# Patient Record
Sex: Female | Born: 1985 | Race: White | Hispanic: No | Marital: Single | Smoking: Never smoker
Health system: Southern US, Community
[De-identification: ages and names within clinical notes are randomized; demographics above are authoritative.]

## PROBLEM LIST (undated history)

## (undated) DIAGNOSIS — Z8742 Personal history of other diseases of the female genital tract: Secondary | ICD-10-CM

## (undated) DIAGNOSIS — E669 Obesity, unspecified: Secondary | ICD-10-CM

## (undated) HISTORY — PX: WISDOM TOOTH EXTRACTION: SHX21

## (undated) HISTORY — DX: Personal history of other diseases of the female genital tract: Z87.42

## (undated) HISTORY — DX: Obesity, unspecified: E66.9

---

## 2014-12-11 DIAGNOSIS — Z8742 Personal history of other diseases of the female genital tract: Secondary | ICD-10-CM

## 2014-12-11 HISTORY — DX: Personal history of other diseases of the female genital tract: Z87.42

## 2014-12-11 HISTORY — PX: LEEP: SHX91

## 2018-08-19 ENCOUNTER — Emergency Department
Admission: EM | Admit: 2018-08-19 | Discharge: 2018-08-19 | Disposition: A | Discharge status: Home / Self Care | Payer: Medicaid Other | Attending: Student in an Organized Health Care Education/Training Program | Admitting: Student in an Organized Health Care Education/Training Program

## 2018-08-19 ENCOUNTER — Other Ambulatory Visit: Payer: Self-pay

## 2018-08-19 ENCOUNTER — Encounter: Payer: Self-pay | Admitting: Emergency Medicine

## 2018-08-19 DIAGNOSIS — N939 Abnormal uterine and vaginal bleeding, unspecified: Secondary | ICD-10-CM

## 2018-08-19 DIAGNOSIS — N938 Other specified abnormal uterine and vaginal bleeding: Secondary | ICD-10-CM | POA: Insufficient documentation

## 2018-08-19 LAB — WET PREP, GENITAL
SPERM: NONE SEEN
TRICH WET PREP: NONE SEEN
Yeast Wet Prep HPF POC: NONE SEEN

## 2018-08-19 LAB — POCT PREGNANCY, URINE: Preg Test, Ur: NEGATIVE

## 2018-08-19 LAB — HCG, QUANTITATIVE, PREGNANCY

## 2018-08-19 NOTE — ED Triage Notes (Signed)
Patient coming into night for vaginal bleeding. Patient's last period was 7/21 and did not have in the month of August and now has been having bleeding occasionally for the last 6 days. Unknown if she is pregnant.

## 2018-08-19 NOTE — ED Provider Notes (Addendum)
[facility] Emergency Department Provider Note    First MD Initiated Contact with Patient 08/19/18 2131     (approximate)  I have reviewed the triage vital signs and the nursing notes.   HISTORY  Chief Complaint Vaginal Bleeding    HPI Sharon Salinas is a 32 y.o. female presents the ER for vaginal bleeding.  States her last mental cycle was on the 21st of last month.  States she was late for her.  And had heavy but vaginal bleeding initially 6 days ago with some lighter spotting.  Was concerned she may be pregnant.  She said G5, P3.  Denies any pelvic pain at this time.  States that she is still spotting.  Does not have established care here.  Denies any vaginal discharge.  No dysuria.  History reviewed. No pertinent past medical history. No family history on file. History reviewed. No pertinent surgical history. There are no active problems to display for this patient.     Prior to Admission medications   Not on File    Allergies Patient has no known allergies.    Social History Social History   Tobacco Use  . Smoking status: Never Smoker  Substance Use Topics  . Alcohol use: Not Currently    Frequency: Never  . Drug use: Not on file    Review of Systems Patient denies headaches, rhinorrhea, blurry vision, numbness, shortness of breath, chest pain, edema, cough, abdominal pain, nausea, vomiting, diarrhea, dysuria, fevers, rashes or hallucinations unless otherwise stated above in HPI. ____________________________________________   PHYSICAL EXAM:  VITAL SIGNS: Vitals:   08/19/18 2034  BP: 117/77  Pulse: 98  Resp: 16  Temp: 98.7 F (37.1 C)  SpO2: 97%    Constitutional: Alert and oriented. Well appearing and in no acute distress. Eyes: Conjunctivae are normal.  Head: Atraumatic. Nose: No congestion/rhinnorhea. Mouth/Throat: Mucous membranes are moist.   Neck: Painless ROM.  Cardiovascular:   Good peripheral  circulation. Respiratory: Normal respiratory effort.  No retractions.  Gastrointestinal: Soft and nontender.  GU: Normal external genitalia.  Cervical os is closed.  No evidence of discharge.  Scant  blood in the vaginal vault. Musculoskeletal: No lower extremity tenderness .  No joint effusions. Neurologic:  Normal speech and language. No gross focal neurologic deficits are appreciated.  Skin:  Skin is warm, dry and intact. No rash noted. Psychiatric: Mood and affect are normal. Speech and behavior are normal.  ____________________________________________   LABS (all labs ordered are listed, but only abnormal results are displayed)  Results for orders placed or performed during the hospital encounter of 08/19/18 (from the past 24 hour(s))  hCG, quantitative, pregnancy     Status: None   Collection Time: 08/19/18  8:40 PM  Result Value Ref Range   hCG, Beta Chain, Quant, S <1 <5 mIU/mL  Pregnancy, urine POC     Status: None   Collection Time: 08/19/18  8:45 PM  Result Value Ref Range   Preg Test, Ur NEGATIVE NEGATIVE   ____________________________________________ ____________________________________________  RADIOLOGY   ____________________________________________   PROCEDURES  Procedure(s) performed:  Procedures    Critical Care performed: no ____________________________________________   INITIAL IMPRESSION / ASSESSMENT AND PLAN / ED COURSE  Pertinent labs & imaging results that were available during my care of the patient were reviewed by me and considered in my medical decision making (see chart for details).  DDX: DOB, AB, ectopic, miscarriage  Sharon Salinas is a 32 y.o. who presents to  the ED with symptoms as described above.  Patient is not pregnant.  Not consistent with ectopic.  Exam as above.  Presentation is reassuring.  Swab sent to evaluate for infectious process.  Not clinically consistent with PID. Will be given referral to OB/GYN.       ____________________________________________   FINAL CLINICAL IMPRESSION(S) / ED DIAGNOSES  Final diagnoses:  Vaginal bleeding      NEW MEDICATIONS STARTED DURING THIS VISIT:  New Prescriptions   No medications on file     Note:  This document was prepared using Dragon voice recognition software and may include unintentional dictation errors.     Willy Eddy, MD 08/19/18 2213    Willy Eddy, MD 08/19/18 2242

## 2018-08-20 LAB — CHLAMYDIA/NGC RT PCR (ARMC ONLY)
Chlamydia Tr: NOT DETECTED
N GONORRHOEAE: NOT DETECTED

## 2018-08-20 NOTE — ED Provider Notes (Signed)
Call attempted to discuss results of wet prep.  VM left with return number to discuss   Willy Eddy, MD 08/20/18 1456

## 2018-09-16 ENCOUNTER — Ambulatory Visit: Payer: Self-pay | Admitting: Family Medicine

## 2018-10-13 ENCOUNTER — Emergency Department
Admission: EM | Admit: 2018-10-13 | Discharge: 2018-10-13 | Disposition: A | Discharge status: Home / Self Care | Payer: Medicaid Other | Attending: Emergency Medicine | Admitting: Emergency Medicine

## 2018-10-13 ENCOUNTER — Encounter: Payer: Self-pay | Admitting: Emergency Medicine

## 2018-10-13 DIAGNOSIS — B3731 Acute candidiasis of vulva and vagina: Secondary | ICD-10-CM

## 2018-10-13 DIAGNOSIS — Z3A00 Weeks of gestation of pregnancy not specified: Secondary | ICD-10-CM | POA: Insufficient documentation

## 2018-10-13 DIAGNOSIS — N898 Other specified noninflammatory disorders of vagina: Secondary | ICD-10-CM | POA: Insufficient documentation

## 2018-10-13 DIAGNOSIS — O23591 Infection of other part of genital tract in pregnancy, first trimester: Secondary | ICD-10-CM | POA: Diagnosis not present

## 2018-10-13 DIAGNOSIS — O9989 Other specified diseases and conditions complicating pregnancy, childbirth and the puerperium: Secondary | ICD-10-CM | POA: Insufficient documentation

## 2018-10-13 DIAGNOSIS — B373 Candidiasis of vulva and vagina: Secondary | ICD-10-CM

## 2018-10-13 DIAGNOSIS — Z3490 Encounter for supervision of normal pregnancy, unspecified, unspecified trimester: Secondary | ICD-10-CM

## 2018-10-13 LAB — URINALYSIS, COMPLETE (UACMP) WITH MICROSCOPIC
BACTERIA UA: NONE SEEN
BILIRUBIN URINE: NEGATIVE
Glucose, UA: NEGATIVE mg/dL
Hgb urine dipstick: NEGATIVE
Ketones, ur: NEGATIVE mg/dL
Nitrite: NEGATIVE
PROTEIN: NEGATIVE mg/dL
SPECIFIC GRAVITY, URINE: 1.02 (ref 1.005–1.030)
pH: 6 (ref 5.0–8.0)

## 2018-10-13 LAB — CHLAMYDIA/NGC RT PCR (ARMC ONLY)
Chlamydia Tr: NOT DETECTED
N GONORRHOEAE: NOT DETECTED

## 2018-10-13 LAB — WET PREP, GENITAL
Clue Cells Wet Prep HPF POC: NONE SEEN
Sperm: NONE SEEN
Trich, Wet Prep: NONE SEEN

## 2018-10-13 LAB — POCT PREGNANCY, URINE: Preg Test, Ur: POSITIVE — AB

## 2018-10-13 MED ORDER — CLOTRIMAZOLE 1 % VA CREA: 1.0000 | TOPICAL_CREAM | Freq: Every day | VAGINAL | 0 refills | Status: AC

## 2018-10-13 NOTE — ED Notes (Signed)
First Nurse Note: Pt to ED for vaginal irritation. Pt is in NAD.

## 2018-10-13 NOTE — ED Provider Notes (Signed)
[facility] Emergency Department Provider Note  Time seen: 11:59 AM  I have reviewed the triage vital signs and the nursing notes.   HISTORY  Chief Complaint Vaginal Pain    HPI Sharon Salinas [place] is a 32 y.o. female with no significant past medical history G6 P3 A2 who presents to the emergency department for vaginal discomfort.  According to the patient over the past several days she has had vaginal swelling and itching.  Denies any abdominal pain.  Took a pregnancy test on Friday which was positive.  Denies any vaginal bleeding.  Minimal discharge, no significant increase in discharge.  Denies any fever.   History reviewed. No pertinent past medical history.  There are no active problems to display for this patient.   History reviewed. No pertinent surgical history.  Prior to Admission medications   Not on File    No Known Allergies  No family history on file.  Social History Social History   Tobacco Use  . Smoking status: Never Smoker  . Smokeless tobacco: Never Used  Substance Use Topics  . Alcohol use: Not Currently    Frequency: Never  . Drug use: Not on file    Review of Systems Constitutional: Negative for fever. Cardiovascular: Negative for chest pain. Respiratory: Negative for shortness of breath. Gastrointestinal: Negative for abdominal pain, vomiting and diarrhea. Genitourinary: Negative for urinary compaints Musculoskeletal: Negative for musculoskeletal complaints Neurological: Negative for headache All other ROS negative  ____________________________________________   PHYSICAL EXAM:  VITAL SIGNS: ED Triage Vitals  Enc Vitals Group     BP 10/13/18 1027 127/62     Pulse Rate 10/13/18 1027 96     Resp 10/13/18 1027 18     Temp 10/13/18 1027 98.5 F (36.9 C)     Temp Source 10/13/18 1027 Oral     SpO2 10/13/18 1027 99 %     Weight 10/13/18 1027 225 lb (102.1 kg)     Height 10/13/18 1027 5\' 9"  (1.753 m)   Head Circumference --      Peak Flow --      Pain Score 10/13/18 1040 3     Pain Loc --      Pain Edu? --      Excl. in GC? --    Constitutional: Alert and oriented. Well appearing and in no distress. Eyes: Normal exam ENT   Head: Normocephalic and atraumatic.   Mouth/Throat: Mucous membranes are moist. Cardiovascular: Normal rate, regular rhythm. No murmur Respiratory: Normal respiratory effort without tachypnea nor retractions. Breath sounds are clear  Gastrointestinal: Soft and nontender. No distention. Musculoskeletal: Nontender with normal range of motion in all extremities. No lower extremity tenderness or edema. Neurologic:  Normal speech and language. No gross focal neurologic deficits are appreciated. Skin:  Skin is warm, dry and intact.  Psychiatric: Mood and affect are normal.   ____________________________________________   INITIAL IMPRESSION / ASSESSMENT AND PLAN / ED COURSE  Pertinent labs & imaging results that were available during my care of the patient were reviewed by me and considered in my medical decision making (see chart for details).  Patient presents to the emergency department for vaginal swelling and itching over the past several days.  Found out she was pregnant 2 days ago.  Last menstrual period was 08/31/2018.  Denies any abdominal pain or vaginal bleeding or significant increase in vaginal discharge.  Patient's urinalysis is largely negative, urine pregnancy test is positive.  We will perform a pelvic examination in  the emergency department.  Believes to be at low risk for STDs.  Patient's wet prep is positive for yeast.  We will discharge on clotrimazole for 7 days.  ____________________________________________   FINAL CLINICAL IMPRESSION(S) / ED DIAGNOSES  Vaginal itching Vaginal swelling Yeast vaginitis   Minna Antis, MD 10/13/18 1253

## 2018-10-13 NOTE — ED Triage Notes (Signed)
Patient presents to the ED with vaginal discomfort.  Patient states she noticed, "swelling" to her vagina yesterday.  Patient denies any unusual discharge or redness.  Patient states she took a home pregnancy test Friday that was positive.  Patient denies urinary frequency or dysuria.

## 2018-10-13 NOTE — ED Notes (Signed)
Pelvic cart placed at bedside at this time. Awaiting MD to perform pelvic exam.

## 2018-10-31 ENCOUNTER — Encounter: Payer: Self-pay | Admitting: Family Medicine

## 2018-10-31 ENCOUNTER — Ambulatory Visit: Payer: Medicaid Other | Admitting: Family Medicine

## 2018-10-31 VITALS — BP 112/64 | HR 78 | Temp 97.9°F | Resp 16 | Ht 69.0 in | Wt 231.1 lb

## 2018-10-31 DIAGNOSIS — Z3200 Encounter for pregnancy test, result unknown: Secondary | ICD-10-CM | POA: Diagnosis not present

## 2018-10-31 DIAGNOSIS — Z3490 Encounter for supervision of normal pregnancy, unspecified, unspecified trimester: Secondary | ICD-10-CM | POA: Insufficient documentation

## 2018-10-31 LAB — HCG, QUANTITATIVE, PREGNANCY: HCG, TOTAL, QN: 57006 m[IU]/mL

## 2018-10-31 NOTE — Progress Notes (Addendum)
Name: Sharon Salinas [place]   MRN: [id number]    DOB: 1986-04-14   Date:10/31/2018       Progress Note  Subjective  Chief Complaint  Chief Complaint  Patient presents with  . Establish Care  . Routine Prenatal Visit    [redacted] weeks pregnant need referral confirmed at ER    HPI  Pt presents to establish care and for referral to GYN. She had positive pregnancy testing at the ER on 10/13/2018.  No imaging was performed at that visit, no blood testing was performed. Denies vaginal bleeding, some mild abdominal discomfort and cramping but nothing severe. Endorses fatigue and nausea, breast tenderness; no vomiting.   Kellie.Ishikawa - She has history Gestational DM, and 1 infant that was LGA. - She is not taking a prenatal vitamin - encouraged her to start ASAP. Denies drug use, smoking, or ETOH use.  - Lives with her 3 children and Female Partner Elmyra Ricks). No pets.   There are no active problems to display for this patient.   Past Surgical History:  Procedure Laterality Date  . LEEP    . WISDOM TOOTH EXTRACTION      Family History  Problem Relation Age of Onset  . Cancer Mother   . Seizures Mother   . Hyperlipidemia Father     Social History   Socioeconomic History  . Marital status: Single    Spouse name: Not on file  . Number of children: 3  . Years of education: Not on file  . Highest education level: Not on file  Occupational History  . Not on file  Social Needs  . Financial resource strain: Not hard at all  . Food insecurity:    Worry: Never true    Inability: Never true  . Transportation needs:    Medical: No    Non-medical: No  Tobacco Use  . Smoking status: Never Smoker  . Smokeless tobacco: Never Used  Substance and Sexual Activity  . Alcohol use: Not Currently    Frequency: Never  . Drug use: Never  . Sexual activity: Yes    Partners: Male  Lifestyle  . Physical activity:    Days per week: 0 days    Minutes per session: 0 min  . Stress: Not at all    Relationships  . Social connections:    Talks on phone: More than three times a week    Gets together: More than three times a week    Attends religious service: Never    Active member of club or organization: No    Attends meetings of clubs or organizations: Not on file    Relationship status: Living with partner  . Intimate partner violence:    Fear of current or ex partner: No    Emotionally abused: No    Physically abused: No    Forced sexual activity: No  Other Topics Concern  . Not on file  Social History Narrative  . Not on file    No current outpatient medications on file.  No Known Allergies  I personally reviewed active problem list, medication list, allergies, notes from last encounter, lab results with the patient/caregiver today.   ROS  Constitutional: Negative for fever or weight change.  Respiratory: Negative for cough and shortness of breath.   Cardiovascular: Negative for chest pain or palpitations.  Gastrointestinal: See HPI.  Musculoskeletal: Negative for gait problem or joint swelling.  Skin: Negative for rash.  Neurological: Negative for dizziness or headache.  No  other specific complaints in a complete review of systems (except as listed in HPI above).  Objective  Vitals:   10/31/18 1307  BP: 112/64  Pulse: 78  Resp: 16  Temp: 97.9 F (36.6 C)  TempSrc: Oral  SpO2: 99%  Weight: 231 lb 1.6 oz (104.8 kg)  Height: 5\' 9"  (1.753 m)   Body mass index is 34.13 kg/m.  Physical Exam  Constitutional: Patient appears well-developed and well-nourished. Obese. No distress.  HENT: Head: Normocephalic and atraumatic. Eyes: Conjunctivae and EOM are normal. No scleral icterus. Neck: Normal range of motion. Neck supple. No JVD present. No thyromegaly present.  Cardiovascular: Normal rate, regular rhythm and normal heart sounds.  No murmur heard. No BLE edema. Pulmonary/Chest: Effort normal and breath sounds normal. No respiratory  distress. Abdominal: Soft. Bowel sounds are normal, no distension. There is no tenderness. No masses. Fundus is not palpable. Musculoskeletal: Normal range of motion, no joint effusions. No gross deformities Neurological: Pt is alert and oriented to person, place, and time. No cranial nerve deficit. Coordination, balance, strength, speech and gait are normal.  Skin: Skin is warm and dry. No rash noted. No erythema.  Psychiatric: Patient has a normal mood and affect. behavior is normal. Judgment and thought content normal.  No results found for this or any previous visit (from the past 72 hour(s)).  PHQ2/9: Depression screen PHQ 2/9 10/31/2018  Decreased Interest 0  Down, Depressed, Hopeless 0  PHQ - 2 Score 0  Altered sleeping 0  Tired, decreased energy 0  Change in appetite 0  Feeling bad or failure about yourself  0  Trouble concentrating 0  Moving slowly or fidgety/restless 0  Suicidal thoughts 0  PHQ-9 Score 0  Difficult doing work/chores Not difficult at all   Fall Risk: Fall Risk  10/31/2018  Falls in the past year? 0  Injury with Fall? 0   Assessment & Plan  1. Possible pregnancy - B-HCG Quant - Ambulatory referral to Obstetrics / Gynecology - START Prenatal Vitamin ASAP. General pregnancy recommendations are provided including tylenol for pain PRN and checking with healthcare provider prior to taking any other medications.

## 2018-11-19 ENCOUNTER — Other Ambulatory Visit (HOSPITAL_COMMUNITY)
Admission: RE | Admit: 2018-11-19 | Discharge: 2018-11-19 | Disposition: A | Discharge status: Home / Self Care | Payer: Medicaid Other | Source: Ambulatory Visit | Attending: Obstetrics and Gynecology | Admitting: Obstetrics and Gynecology

## 2018-11-19 ENCOUNTER — Ambulatory Visit (INDEPENDENT_AMBULATORY_CARE_PROVIDER_SITE_OTHER): Payer: Medicaid Other

## 2018-11-19 ENCOUNTER — Other Ambulatory Visit: Payer: Self-pay | Admitting: Obstetrics and Gynecology

## 2018-11-19 ENCOUNTER — Encounter: Payer: Self-pay | Admitting: Obstetrics and Gynecology

## 2018-11-19 ENCOUNTER — Ambulatory Visit (INDEPENDENT_AMBULATORY_CARE_PROVIDER_SITE_OTHER): Payer: Medicaid Other | Admitting: Obstetrics and Gynecology

## 2018-11-19 VITALS — BP 108/67 | HR 69 | Ht 69.0 in | Wt 230.6 lb

## 2018-11-19 DIAGNOSIS — O3481 Maternal care for other abnormalities of pelvic organs, first trimester: Secondary | ICD-10-CM | POA: Diagnosis not present

## 2018-11-19 DIAGNOSIS — E668 Other obesity: Secondary | ICD-10-CM

## 2018-11-19 DIAGNOSIS — Z3A11 11 weeks gestation of pregnancy: Secondary | ICD-10-CM | POA: Diagnosis not present

## 2018-11-19 DIAGNOSIS — O09291 Supervision of pregnancy with other poor reproductive or obstetric history, first trimester: Secondary | ICD-10-CM | POA: Diagnosis not present

## 2018-11-19 DIAGNOSIS — Z9889 Other specified postprocedural states: Secondary | ICD-10-CM | POA: Insufficient documentation

## 2018-11-19 DIAGNOSIS — Z3491 Encounter for supervision of normal pregnancy, unspecified, first trimester: Secondary | ICD-10-CM | POA: Diagnosis not present

## 2018-11-19 DIAGNOSIS — Z0283 Encounter for blood-alcohol and blood-drug test: Secondary | ICD-10-CM

## 2018-11-19 DIAGNOSIS — O09299 Supervision of pregnancy with other poor reproductive or obstetric history, unspecified trimester: Secondary | ICD-10-CM

## 2018-11-19 DIAGNOSIS — Z124 Encounter for screening for malignant neoplasm of cervix: Secondary | ICD-10-CM

## 2018-11-19 DIAGNOSIS — Z113 Encounter for screening for infections with a predominantly sexual mode of transmission: Secondary | ICD-10-CM

## 2018-11-19 DIAGNOSIS — O99211 Obesity complicating pregnancy, first trimester: Secondary | ICD-10-CM

## 2018-11-19 DIAGNOSIS — Z3A10 10 weeks gestation of pregnancy: Secondary | ICD-10-CM | POA: Diagnosis not present

## 2018-11-19 DIAGNOSIS — N8311 Corpus luteum cyst of right ovary: Secondary | ICD-10-CM

## 2018-11-19 DIAGNOSIS — N83291 Other ovarian cyst, right side: Secondary | ICD-10-CM | POA: Diagnosis not present

## 2018-11-19 DIAGNOSIS — Z8632 Personal history of gestational diabetes: Secondary | ICD-10-CM

## 2018-11-19 DIAGNOSIS — E669 Obesity, unspecified: Secondary | ICD-10-CM | POA: Insufficient documentation

## 2018-11-19 LAB — POCT URINALYSIS DIPSTICK OB
Bilirubin, UA: NEGATIVE
Blood, UA: NEGATIVE
Glucose, UA: NEGATIVE
Ketones, UA: NEGATIVE
Leukocytes, UA: NEGATIVE
NITRITE UA: NEGATIVE
PROTEIN: NEGATIVE
SPEC GRAV UA: 1.02 (ref 1.010–1.025)
Urobilinogen, UA: 1 E.U./dL
pH, UA: 6 (ref 5.0–8.0)

## 2018-11-19 NOTE — Progress Notes (Signed)
Pt is present today for NOB PE. Pt stated that she is doing well no complaints.         Minna MerrittsCarolyn Nicole [place] presents for NOB nurse intake visit. [facility]18, with Maurice SmallEmily Boyce, FNP.  G6.  [id number].  LMP.08/31/18.  EDD 06/07/2019.  Ga. U/S 11/19/18 EDD 7/6/20Pregnancy education material explained and given.  0 cats in the home.  NOB labs ordered. BMI greater than 30. TSH/HbgA1c ordered.  HIV and drug screen explained and ordered. Genetic screening discussed. Genetic testing; Ordered. PNV encouraged. Pt is to follow up in 4 weeks for routine prenatal care with [facility]. [facility] and [facility] paper work was explained and signed. Pt stated that she understood.

## 2018-11-19 NOTE — Progress Notes (Signed)
OBSTETRIC INITIAL PRENATAL VISIT  Subjective:    Sharon Salinas [place] is being seen today for her first obstetrical visit.  This is not a planned pregnancy. Had confirmation at PCP office.  She is a [id number] female at [redacted]w[redacted]d gestation, Estimated Date of Delivery: 06/07/19 with Patient's last menstrual period was 08/31/2018 (exact dates).  Her obstetrical history is significant for history of gestational DM in last pregnancy (diet controlled). Relationship with FOB: spouse, living together. Patient does intend to breast feed. Pregnancy history fully reviewed.    OB History  Gravida Para Term Preterm AB Living  6 3 3  0 2 3  SAB TAB Ectopic Multiple Live Births  0 2 0 0 3    # Outcome Date GA Lbr Len/2nd Weight Sex Delivery Anes PTL Lv  6 Current           5 TAB 2017          4 Term 2014 [redacted]w[redacted]d  8 lb 13 oz (3.997 kg) F Vag-Spont   LIV  3 Term 2012 [redacted]w[redacted]d  9 lb 6 oz (4.252 kg) F Vag-Spont   LIV  2 TAB 2011          1 Term 2006 [redacted]w[redacted]d  7 lb 12 oz (3.515 kg) F Vag-Spont   LIV    Obstetric Comments  G2 and G3 were inductions of labor.     Gynecologic History:  Last pap smear was 201or 2018 (patient is unsure).  Results were normal.  Admits h/o abnormal pap smear in the past.  H/o LEEP procedure in 2016. Denies  history of STIs.    Past Medical History:  Diagnosis Date  . No pertinent past medical history      Family History  Problem Relation Age of Onset  . Cancer Mother   . Seizures Mother   . Hyperlipidemia Father      Past Surgical History:  Procedure Laterality Date  . LEEP  2016  . WISDOM TOOTH EXTRACTION       Social History   Socioeconomic History  . Marital status: Single    Spouse name: Not on file  . Number of children: 3  . Years of education: Not on file  . Highest education level: Not on file  Occupational History  . Not on file  Social Needs  . Financial resource strain: Not hard at all  . Food insecurity:    Worry: Never true    Inability:  Never true  . Transportation needs:    Medical: No    Non-medical: No  Tobacco Use  . Smoking status: Never Smoker  . Smokeless tobacco: Never Used  Substance and Sexual Activity  . Alcohol use: Not Currently    Frequency: Never  . Drug use: Never  . Sexual activity: Yes    Partners: Male  Lifestyle  . Physical activity:    Days per week: 0 days    Minutes per session: 0 min  . Stress: Not at all  Relationships  . Social connections:    Talks on phone: More than three times a week    Gets together: More than three times a week    Attends religious service: Never    Active member of club or organization: No    Attends meetings of clubs or organizations: Not on file    Relationship status: Living with partner  . Intimate partner violence:    Fear of current or ex partner: No    Emotionally abused: No  Physically abused: No    Forced sexual activity: No  Other Topics Concern  . Not on file  Social History Narrative  . Not on file     Current Outpatient Medications on File Prior to Visit  Medication Sig Dispense Refill  . acetaminophen (TYLENOL) 500 MG tablet Take 500 mg by mouth every 6 (six) hours as needed.    . Prenatal Vit-Fe Fumarate-FA (PRENATAL MULTIVITAMIN) TABS tablet Take 1 tablet by mouth daily at 12 noon.     No current facility-administered medications on file prior to visit.      No Known Allergies    Review of Systems General:Not Present- Fever, Weight Loss and Weight Gain. Skin:Not Present- Rash. HEENT:Not Present- Blurred Vision, Headache and Bleeding Gums. Respiratory:Not Present- Difficulty Breathing. Breast:Not Present- Breast Mass. Cardiovascular:Not Present- Chest Pain, Elevated Blood Pressure, Fainting / Blacking Out and Shortness of Breath. Gastrointestinal:Not Present- Abdominal Pain, Constipation, Vomiting. Positive for nausea (however is resolving). Female Genitourinary:Not Present- Frequency, Painful Urination, Pelvic Pain,  Vaginal Bleeding, Vaginal Discharge, Contractions, regular, Fetal Movements Decreased, Urinary Complaints and Vaginal Fluid. Musculoskeletal:Not Present- Back Pain and Leg Cramps. Neurological:Not Present- Dizziness. Psychiatric:Not Present- Depression.     Objective:   Blood pressure 108/67, pulse 69, height 5\' 9"  (1.753 m), weight 230 lb 9.6 oz (104.6 kg), last menstrual period 08/31/2018.  Body mass index is 34.05 kg/m.  General Appearance:    Alert, cooperative, no distress, appears stated age, moderate obesity  Head:    Normocephalic, without obvious abnormality, atraumatic  Eyes:    PERRL, conjunctiva/corneas clear, EOM's intact, both eyes  Ears:    Normal external ear canals, both ears  Nose:   Nares normal, septum midline, mucosa normal, no drainage or sinus tenderness  Throat:   Lips, mucosa, and tongue normal; teeth and gums normal  Neck:   Supple, symmetrical, trachea midline, no adenopathy; thyroid: no enlargement/tenderness/nodules; no carotid bruit or JVD  Back:     Symmetric, no curvature, ROM normal, no CVA tenderness  Lungs:     Clear to auscultation bilaterally, respirations unlabored  Chest Wall:    No tenderness or deformity   Heart:    Regular rate and rhythm, S1 and S2 normal, no murmur, rub or gallop  Breast Exam:    No tenderness, masses, or nipple abnormality  Abdomen:     Soft, non-tender, bowel sounds active all four quadrants, no masses, no organomegaly. FHT unable to obtain by Doppler.  Genitalia:    Pelvic:external genitalia normal, vagina without lesions, discharge, or tenderness, rectovaginal septum  normal. Cervix normal in appearance, no cervical motion tenderness, no adnexal masses or tenderness.  Pregnancy positive findings: uterine enlargement: 10-11 wk size, nontender.   Rectal:    Normal external sphincter.  No hemorrhoids appreciated. Internal exam not done.   Extremities:   Extremities normal, atraumatic, no cyanosis or edema  Pulses:   2+ and  symmetric all extremities  Skin:   Skin color, texture, turgor normal, no rashes or lesions  Lymph nodes:   Cervical, supraclavicular, and axillary nodes normal  Neurologic:   CNII-XII intact, normal strength, sensation and reflexes throughout       Assessment:    Pregnancy at 11 and 3/7 weeks by dates   History of abnormal pap smear with LEEP Moderate obesity History of gestational diabetes (diet controlled)  Plan:    Initial labs ordered.  Will get ultrasound today to assess for viability/dating as unable to detect FHT on exam.  Prenatal vitamins encouraged. Problem  list reviewed and updated. New OB counseling:  The patient has been given an overview regarding routine prenatal care.  Recommendations regarding diet, weight gain, and exercise in pregnancy were given. Prenatal testing, optional genetic testing, and ultrasound use in pregnancy were reviewed.  Patient desires cell-free DNA testing with [facility]. Will order.  Benefits of Breast Feeding were discussed. The patient is encouraged to consider nursing her baby post partum. History of GDM in last pregnancy. Will need early glucola next visit.  History of LEEP with no prior history of preterm birth or cervical incompetence. Follow up in 4 weeks.  50% of 30 minute visit spent on counseling and coordination of care.     The patient has Medicaid.  [facility] Medicaid Risk Screening Form completed today   Hildred Laserherry, Jamin Panther, MD [facility]

## 2018-11-19 NOTE — Patient Instructions (Signed)
First Trimester of Pregnancy The first trimester of pregnancy is from week 1 until the end of week 13 (months 1 through 3). During this time, your baby will begin to develop inside you. At 6-8 weeks, the eyes and face are formed, and the heartbeat can be seen on ultrasound. At the end of 12 weeks, all the baby's organs are formed. Prenatal care is all the medical care you receive before the birth of your baby. Make sure you get good prenatal care and follow all of your doctor's instructions. Follow these instructions at home: Medicines  Take over-the-counter and prescription medicines only as told by your doctor. Some medicines are safe and some medicines are not safe during pregnancy.  Take a prenatal vitamin that contains at least 600 micrograms (mcg) of folic acid.  If you have trouble pooping (constipation), take medicine that will make your stool soft (stool softener) if your doctor approves. Eating and drinking  Eat regular, healthy meals.  Your doctor will tell you the amount of weight gain that is right for you.  Avoid raw meat and uncooked cheese.  If you feel sick to your stomach (nauseous) or throw up (vomit): ? Eat 4 or 5 small meals a day instead of 3 large meals. ? Try eating a few soda crackers. ? Drink liquids between meals instead of during meals.  To prevent constipation: ? Eat foods that are high in fiber, like fresh fruits and vegetables, whole grains, and beans. ? Drink enough fluids to keep your pee (urine) clear or pale yellow. Activity  Exercise only as told by your doctor. Stop exercising if you have cramps or pain in your lower belly (abdomen) or low back.  Do not exercise if it is too hot, too humid, or if you are in a place of great height (high altitude).  Try to avoid standing for long periods of time. Move your legs often if you must stand in one place for a long time.  Avoid heavy lifting.  Wear low-heeled shoes. Sit and stand up straight.  You  can have sex unless your doctor tells you not to. Relieving pain and discomfort  Wear a good support bra if your breasts are sore.  Take warm water baths (sitz baths) to soothe pain or discomfort caused by hemorrhoids. Use hemorrhoid cream if your doctor says it is okay.  Rest with your legs raised if you have leg cramps or low back pain.  If you have puffy, bulging veins (varicose veins) in your legs: ? Wear support hose or compression stockings as told by your doctor. ? Raise (elevate) your feet for 15 minutes, 3-4 times a day. ? Limit salt in your food. Prenatal care  Schedule your prenatal visits by the twelfth week of pregnancy.  Write down your questions. Take them to your prenatal visits.  Keep all your prenatal visits as told by your doctor. This is important. Safety  Wear your seat belt at all times when driving.  Make a list of emergency phone numbers. The list should include numbers for family, friends, the hospital, and police and fire departments. General instructions  Ask your doctor for a referral to a local prenatal class. Begin classes no later than at the start of month 6 of your pregnancy.  Ask for help if you need counseling or if you need help with nutrition. Your doctor can give you advice or tell you where to go for help.  Do not use hot tubs, steam rooms, or  saunas.  Do not douche or use tampons or scented sanitary pads.  Do not cross your legs for long periods of time.  Avoid all herbs and alcohol. Avoid drugs that are not approved by your doctor.  Do not use any tobacco products, including cigarettes, chewing tobacco, and electronic cigarettes. If you need help quitting, ask your doctor. You may get counseling or other support to help you quit.  Avoid cat litter boxes and soil used by cats. These carry germs that can cause birth defects in the baby and can cause a loss of your baby (miscarriage) or stillbirth.  Visit your dentist. At home, brush  your teeth with a soft toothbrush. Be gentle when you floss. Contact a doctor if:  You are dizzy.  You have mild cramps or pressure in your lower belly.  You have a nagging pain in your belly area.  You continue to feel sick to your stomach, you throw up, or you have watery poop (diarrhea).  You have a bad smelling fluid coming from your vagina.  You have pain when you pee (urinate).  You have increased puffiness (swelling) in your face, hands, legs, or ankles. Get help right away if:  You have a fever.  You are leaking fluid from your vagina.  You have spotting or bleeding from your vagina.  You have very bad belly cramping or pain.  You gain or lose weight rapidly.  You throw up blood. It may look like coffee grounds.  You are around people who have [place] measles, fifth disease, or chickenpox.  You have a very bad headache.  You have shortness of breath.  You have any kind of trauma, such as from a fall or a car accident. Summary  The first trimester of pregnancy is from week 1 until the end of week 13 (months 1 through 3).  To take care of yourself and your unborn baby, you will need to eat healthy meals, take medicines only if your doctor tells you to do so, and do activities that are safe for you and your baby.  Keep all follow-up visits as told by your doctor. This is important as your doctor will have to ensure that your baby is healthy and growing well. This information is not intended to replace advice given to you by your health care provider. Make sure you discuss any questions you have with your health care provider. Document Released: 05/15/2008 Document Revised: 12/05/2016 Document Reviewed: 12/05/2016 Elsevier Interactive Patient Education  2017 [place]. Common Medications Safe in Pregnancy  Acne:      Constipation:  Benzoyl Peroxide     Colace  Clindamycin      Dulcolax Suppository  Topica Erythromycin     Fibercon  Salicylic  Acid      Metamucil         Miralax AVOID:        Senakot   Accutane    Cough:  Retin-A       Cough Drops  Tetracycline      Phenergan w/ Codeine if Rx  Minocycline      Robitussin (Plain & DM)  Antibiotics:     Crabs/Lice:  Ceclor       RID  Cephalosporins    AVOID:  E-Mycins      Kwell  Keflex  Macrobid/Macrodantin   Diarrhea:  Penicillin      Kao-Pectate  Zithromax      Imodium AD         PUSH  FLUIDS AVOID:       Cipro     Fever:  Tetracycline      Tylenol (Regular or Extra  Minocycline       Strength)  Levaquin      Extra Strength-Do not          Exceed 8 tabs/24 hrs Caffeine:        <200mg/day (equiv. To 1 cup of coffee or  approx. 3 12 oz sodas)         Gas: Cold/Hayfever:       Gas-X  Benadryl      Mylicon  Claritin       Phazyme  **Claritin-D        Chlor-Trimeton    Headaches:  Dimetapp      ASA-Free Excedrin  Drixoral-Non-Drowsy     Cold Compress  Mucinex (Guaifenasin)     Tylenol (Regular or Extra  Sudafed/Sudafed-12 Hour     Strength)  **Sudafed PE Pseudoephedrine   Tylenol Cold & Sinus     Vicks Vapor Rub  Zyrtec  **AVOID if Problems With Blood Pressure         Heartburn: Avoid lying down for at least 1 hour after meals  Aciphex      Maalox     Rash:  Milk of Magnesia     Benadryl    Mylanta       1% Hydrocortisone Cream  Pepcid  Pepcid Complete   Sleep Aids:  Prevacid      Ambien   Prilosec       Benadryl  Rolaids       Chamomile Tea  Tums (Limit 4/day)     Unisom  Zantac       Tylenol PM         Warm milk-add vanilla or  Hemorrhoids:       Sugar for taste  Anusol/Anusol H.C.  (RX: Analapram 2.5%)  Sugar Substitutes:  Hydrocortisone OTC     Ok in moderation  Preparation H      Tucks        Vaseline lotion applied to tissue with wiping    Herpes:     Throat:  Acyclovir      Oragel  Famvir  Valtrex     Vaccines:         Flu Shot Leg Cramps:       *Gardasil  Benadryl      Hepatitis A         Hepatitis B Nasal  Spray:       Pneumovax  Saline Nasal Spray     Polio Booster         Tetanus Nausea:       Tuberculosis test or PPD  Vitamin B6 25 mg TID   AVOID:    Dramamine      *Gardasil  Emetrol       Live Poliovirus  Ginger Root 250 mg QID    MMR (measles, mumps &  High Complex Carbs @ Bedtime    rebella)  Sea Bands-Accupressure    Varicella (Chickenpox)  Unisom 1/2 tab TID     *No known complications           If received before Pain:         Known pregnancy;   Darvocet       Resume series after  Lortab        Delivery  Percocet    Yeast:   Tramadol        Femstat  Tylenol 3      Gyne-lotrimin  Ultram       Monistat  Vicodin           MISC:         All Sunscreens           Hair Coloring/highlights          Insect Repellant's          (Including DEET)         Mystic Tans  

## 2018-11-20 ENCOUNTER — Telehealth: Payer: Self-pay | Admitting: Obstetrics and Gynecology

## 2018-11-20 LAB — URINALYSIS, ROUTINE W REFLEX MICROSCOPIC
Bilirubin, UA: NEGATIVE
Glucose, UA: NEGATIVE
LEUKOCYTES UA: NEGATIVE
Nitrite, UA: NEGATIVE
Protein, UA: NEGATIVE
RBC, UA: NEGATIVE
Specific Gravity, UA: 1.022 (ref 1.005–1.030)
Urobilinogen, Ur: 1 mg/dL (ref 0.2–1.0)
pH, UA: 6 (ref 5.0–7.5)

## 2018-11-20 LAB — DRUG PROFILE, UR, 9 DRUGS (LABCORP)
AMPHETAMINES, URINE: NEGATIVE ng/mL
Barbiturate Quant, Ur: NEGATIVE ng/mL
Benzodiazepine Quant, Ur: NEGATIVE ng/mL
COCAINE (METAB.): NEGATIVE ng/mL
Cannabinoid Quant, Ur: NEGATIVE ng/mL
METHADONE SCREEN, URINE: NEGATIVE ng/mL
OPIATE QUANT UR: NEGATIVE ng/mL
PCP Quant, Ur: NEGATIVE ng/mL
Propoxyphene: NEGATIVE ng/mL

## 2018-11-20 LAB — VARICELLA ZOSTER ANTIBODY, IGG: VARICELLA: 1166 {index} (ref >165)

## 2018-11-20 LAB — ABO AND RH: Rh Factor: POSITIVE

## 2018-11-20 LAB — HEMOGLOBIN A1C
Est. average glucose Bld gHb Est-mCnc: 105 mg/dL
HEMOGLOBIN A1C: 5.3 % (ref 4.8–5.6)

## 2018-11-20 LAB — RPR: RPR: NONREACTIVE

## 2018-11-20 LAB — NICOTINE SCREEN, URINE: Cotinine Ql Scrn, Ur: NEGATIVE ng/mL

## 2018-11-20 LAB — ANTIBODY SCREEN: Antibody Screen: NEGATIVE

## 2018-11-20 LAB — TSH: TSH: 1.3 u[IU]/mL (ref 0.450–4.500)

## 2018-11-20 LAB — HIV ANTIBODY (ROUTINE TESTING W REFLEX): HIV SCREEN 4TH GENERATION: NONREACTIVE

## 2018-11-20 LAB — RUBELLA SCREEN: Rubella Antibodies, IGG: 6.29 index (ref >0.99)

## 2018-11-20 LAB — HEPATITIS B SURFACE ANTIGEN: Hepatitis B Surface Ag: NEGATIVE

## 2018-11-20 NOTE — Telephone Encounter (Signed)
Please inform patient that she does have a cyst on her right ovary, is about the size of a plum.  This is common in pregnancy and usually will be gone by the time of her next ultrasound at 20 weeks.  If she begins to have significant pain on her right side, we would need to evaluate her sooner to make sure that it has not grown in size.    Dr. Valentino Saxonherry

## 2018-11-20 NOTE — Telephone Encounter (Signed)
The patient called and stated that she would like to speak with her nurse in regards to checking the status of her u/s being reviewed by Dr. Valentino Saxonherry. No other information was disclosed. Please advise.

## 2018-11-21 LAB — GC/CHLAMYDIA PROBE AMP
Chlamydia trachomatis, NAA: NEGATIVE
Neisseria gonorrhoeae by PCR: NEGATIVE

## 2018-11-21 LAB — URINE CULTURE, OB REFLEX: Organism ID, Bacteria: NO GROWTH

## 2018-11-21 LAB — CULTURE, OB URINE

## 2018-11-21 NOTE — Telephone Encounter (Signed)
[facility]

## 2018-11-22 LAB — CYTOLOGY - PAP
Diagnosis: NEGATIVE
HPV: NOT DETECTED

## 2018-12-02 ENCOUNTER — Emergency Department
Admission: EM | Admit: 2018-12-02 | Discharge: 2018-12-02 | Disposition: A | Discharge status: Home / Self Care | Payer: Medicaid Other | Attending: Student in an Organized Health Care Education/Training Program | Admitting: Student in an Organized Health Care Education/Training Program

## 2018-12-02 ENCOUNTER — Encounter: Payer: Self-pay | Admitting: Emergency Medicine

## 2018-12-02 ENCOUNTER — Emergency Department: Payer: Medicaid Other

## 2018-12-02 ENCOUNTER — Other Ambulatory Visit: Payer: Self-pay

## 2018-12-02 DIAGNOSIS — O9989 Other specified diseases and conditions complicating pregnancy, childbirth and the puerperium: Secondary | ICD-10-CM | POA: Insufficient documentation

## 2018-12-02 DIAGNOSIS — R0602 Shortness of breath: Secondary | ICD-10-CM | POA: Diagnosis not present

## 2018-12-02 DIAGNOSIS — Z3A12 12 weeks gestation of pregnancy: Secondary | ICD-10-CM | POA: Insufficient documentation

## 2018-12-02 DIAGNOSIS — R071 Chest pain on breathing: Secondary | ICD-10-CM | POA: Diagnosis not present

## 2018-12-02 DIAGNOSIS — R0789 Other chest pain: Secondary | ICD-10-CM | POA: Insufficient documentation

## 2018-12-02 LAB — COMPREHENSIVE METABOLIC PANEL
ALT: 12 U/L (ref 0–44)
AST: 15 U/L (ref 15–41)
Albumin: 3.7 g/dL (ref 3.5–5.0)
Alkaline Phosphatase: 44 U/L (ref 38–126)
Anion gap: 8 (ref 5–15)
BUN: 12 mg/dL (ref 6–20)
CO2: 25 mmol/L (ref 22–32)
Calcium: 9 mg/dL (ref 8.9–10.3)
Chloride: 105 mmol/L (ref 98–111)
Creatinine, Ser: 0.62 mg/dL (ref 0.44–1.00)
GFR calc Af Amer: >60 mL/min (ref >60)
GFR calc non Af Amer: >60 mL/min (ref >60)
Glucose, Bld: 133 mg/dL — ABNORMAL HIGH (ref 70–99)
POTASSIUM: 3.3 mmol/L — AB (ref 3.5–5.1)
Sodium: 138 mmol/L (ref 135–145)
Total Bilirubin: 0.3 mg/dL (ref 0.3–1.2)
Total Protein: 7.3 g/dL (ref 6.5–8.1)

## 2018-12-02 LAB — URINALYSIS, COMPLETE (UACMP) WITH MICROSCOPIC
Bilirubin Urine: NEGATIVE
Glucose, UA: NEGATIVE mg/dL
Hgb urine dipstick: NEGATIVE
Ketones, ur: NEGATIVE mg/dL
Leukocytes, UA: NEGATIVE
Nitrite: NEGATIVE
Protein, ur: NEGATIVE mg/dL
Specific Gravity, Urine: 1.015 (ref 1.005–1.030)
WBC, UA: NONE SEEN WBC/hpf (ref 0–5)
pH: 7 (ref 5.0–8.0)

## 2018-12-02 LAB — CBC
HCT: 38.6 % (ref 36.0–46.0)
Hemoglobin: 12.8 g/dL (ref 12.0–15.0)
MCH: 29.3 pg (ref 26.0–34.0)
MCHC: 33.2 g/dL (ref 30.0–36.0)
MCV: 88.3 fL (ref 80.0–100.0)
Platelets: 305 10*3/uL (ref 150–400)
RBC: 4.37 MIL/uL (ref 3.87–5.11)
RDW: 12.5 % (ref 11.5–15.5)
WBC: 17 10*3/uL — ABNORMAL HIGH (ref 4.0–10.5)
nRBC: 0 % (ref 0.0–0.2)

## 2018-12-02 LAB — LIPASE, BLOOD: Lipase: 27 U/L (ref 11–51)

## 2018-12-02 LAB — POCT PREGNANCY, URINE: Preg Test, Ur: POSITIVE — AB

## 2018-12-02 LAB — TROPONIN I: Troponin I: <0.03 ng/mL (ref <0.03)

## 2018-12-02 LAB — FIBRIN DERIVATIVES D-DIMER (ARMC ONLY): Fibrin derivatives D-dimer (ARMC): 282.02 ng/mL (FEU) (ref 0.00–499.00)

## 2018-12-02 MED ORDER — ACETAMINOPHEN 500 MG PO TABS
1000.0000 mg | ORAL_TABLET | Freq: Once | ORAL | Status: AC
  Administered 2018-12-02: 1000 mg via ORAL
  Filled 2018-12-02: qty 2

## 2018-12-02 NOTE — ED Notes (Signed)
Pt back from US att

## 2018-12-02 NOTE — ED Provider Notes (Signed)
[facility] Emergency Department Provider Note    First MD Initiated Contact with Patient 12/02/18 [phone]     (approximate)  I have reviewed the triage vital signs and the nursing notes.   HISTORY  Chief Complaint Chest Pain    HPI Sharon Salinas is a 31 y.o. female presents the ER for evaluation of right posterior chest pain.  Symptoms became more severe today.  She is had 4 episodes similar to this in the past.  Has had x-rays but no other additional evaluation.  States it does hurt to take a deep breath and she does have associated shortness of breath and fatigue with it.  States that the pain was so severe she was almost doubled over in pain.  States it wraps around to the right anterior aspect of her chest.  Denies any trauma or rashes.  Denies any fevers.  Does not smoke.  She is currently pregnant.    Past Medical History:  Diagnosis Date  . History of abnormal cervical Pap smear 2016   s/p LEEP  . Obesity (BMI 30-39.9)    Family History  Problem Relation Age of Onset  . Cancer Mother   . Seizures Mother   . Hyperlipidemia Father    Past Surgical History:  Procedure Laterality Date  . LEEP  2016  . WISDOM TOOTH EXTRACTION     Patient Active Problem List   Diagnosis Date Noted  . History of gestational diabetes in prior pregnancy, currently pregnant 11/19/2018  . Mild obesity 11/19/2018  . History of loop electrical excision procedure (LEEP) 11/19/2018  . Pregnancy 10/31/2018      Prior to Admission medications   Medication Sig Start Date End Date Taking? Authorizing Provider  acetaminophen (TYLENOL) 500 MG tablet Take 500 mg by mouth every 6 (six) hours as needed.    [provider]  Prenatal Vit-Fe Fumarate-FA (PRENATAL MULTIVITAMIN) TABS tablet Take 1 tablet by mouth daily at 12 noon.    [provider]    Allergies Patient has no known allergies.    Social History Social History   Tobacco Use  .  Smoking status: Never Smoker  . Smokeless tobacco: Never Used  Substance Use Topics  . Alcohol use: Not Currently    Frequency: Never  . Drug use: Never    Review of Systems Patient denies headaches, rhinorrhea, blurry vision, numbness, shortness of breath, chest pain, edema, cough, abdominal pain, nausea, vomiting, diarrhea, dysuria, fevers, rashes or hallucinations unless otherwise stated above in HPI. ____________________________________________   PHYSICAL EXAM:  VITAL SIGNS: Vitals:   12/02/18 0548 12/02/18 0630  BP: 113/60 119/66  Pulse: 65 66  Resp: 12 15  Temp:    SpO2: 97% 97%    Constitutional: Alert and oriented.  Eyes: Conjunctivae are normal.  Head: Atraumatic. Nose: No congestion/rhinnorhea. Mouth/Throat: Mucous membranes are moist.   Neck: No stridor. Painless ROM.  Cardiovascular: Normal rate, regular rhythm. Grossly normal heart sounds.  Good peripheral circulation. Respiratory: Normal respiratory effort.  No retractions. Lungs CTAB. Gastrointestinal: Soft and nontender. No distention. No abdominal bruits. No CVA tenderness. Genitourinary:  Musculoskeletal: some ttp of right posterior chest wall without deformity, erythema or crepitus. No lower extremity tenderness nor edema.  No joint effusions. Neurologic:  Normal speech and language. No gross focal neurologic deficits are appreciated. No facial droop Skin:  Skin is warm, dry and intact. No rash noted. Psychiatric: Mood and affect are normal. Speech and behavior are normal.  ____________________________________________  LABS (all labs ordered are listed, but only abnormal results are displayed)  Results for orders placed or performed during the hospital encounter of 12/02/18 (from the past 24 hour(s))  CBC     Status: Abnormal   Collection Time: 12/02/18  2:28 AM  Result Value Ref Range   WBC 17.0 (H) 4.0 - 10.5 K/uL   RBC 4.37 3.87 - 5.11 MIL/uL   Hemoglobin 12.8 12.0 - 15.0 g/dL   HCT 16.138.6 09.636.0  - 04.546.0 %   MCV 88.3 80.0 - 100.0 fL   MCH 29.3 26.0 - 34.0 pg   MCHC 33.2 30.0 - 36.0 g/dL   RDW 40.912.5 81.111.5 - 91.415.5 %   Platelets 305 150 - 400 K/uL   nRBC 0.0 0.0 - 0.2 %  Troponin I - ONCE - STAT     Status: None   Collection Time: 12/02/18  2:28 AM  Result Value Ref Range   Troponin I <0.03 <0.03 ng/mL  Comprehensive metabolic panel     Status: Abnormal   Collection Time: 12/02/18  2:28 AM  Result Value Ref Range   Sodium 138 135 - 145 mmol/L   Potassium 3.3 (L) 3.5 - 5.1 mmol/L   Chloride 105 98 - 111 mmol/L   CO2 25 22 - 32 mmol/L   Glucose, Bld 133 (H) 70 - 99 mg/dL   BUN 12 6 - 20 mg/dL   Creatinine, Ser 7.820.62 0.44 - 1.00 mg/dL   Calcium 9.0 8.9 - 95.610.3 mg/dL   Total Protein 7.3 6.5 - 8.1 g/dL   Albumin 3.7 3.5 - 5.0 g/dL   AST 15 15 - 41 U/L   ALT 12 0 - 44 U/L   Alkaline Phosphatase 44 38 - 126 U/L   Total Bilirubin 0.3 0.3 - 1.2 mg/dL   GFR calc non Af Amer >60 >60 mL/min   GFR calc Af Amer >60 >60 mL/min   Anion gap 8 5 - 15  Lipase, blood     Status: None   Collection Time: 12/02/18  2:28 AM  Result Value Ref Range   Lipase 27 11 - 51 U/L  Urinalysis, Complete w Microscopic     Status: Abnormal   Collection Time: 12/02/18  2:28 AM  Result Value Ref Range   Color, Urine YELLOW (A) YELLOW   APPearance TURBID (A) CLEAR   Specific Gravity, Urine 1.015 1.005 - 1.030   pH 7.0 5.0 - 8.0   Glucose, UA NEGATIVE NEGATIVE mg/dL   Hgb urine dipstick NEGATIVE NEGATIVE   Bilirubin Urine NEGATIVE NEGATIVE   Ketones, ur NEGATIVE NEGATIVE mg/dL   Protein, ur NEGATIVE NEGATIVE mg/dL   Nitrite NEGATIVE NEGATIVE   Leukocytes, UA NEGATIVE NEGATIVE   WBC, UA NONE SEEN 0 - 5 WBC/hpf   Bacteria, UA RARE (A) NONE SEEN   Squamous Epithelial / LPF 0-5 0 - 5  Pregnancy, urine POC     Status: Abnormal   Collection Time: 12/02/18  2:35 AM  Result Value Ref Range   Preg Test, Ur POSITIVE (A) NEGATIVE  Fibrin derivatives D-Dimer ([facility] only)     Status: None   Collection Time:  12/02/18  6:10 AM  Result Value Ref Range   Fibrin derivatives D-dimer ([facility]) 282.02 0.00 - 499.00 ng/mL (FEU)   ____________________________________________  EKG My review and personal interpretation at Time: 2:23   Indication: chest pain  Rate: 75  Rhythm: sinus Axis: normal Other: normal intervals, no stemi ____________________________________________  RADIOLOGY  I personally reviewed all radiographic  images ordered to evaluate for the above acute complaints and reviewed radiology reports and findings.  These findings were personally discussed with the patient.  Please see medical record for radiology report.  ____________________________________________   PROCEDURES  Procedure(s) performed:  Procedures    Critical Care performed: no ____________________________________________   INITIAL IMPRESSION / ASSESSMENT AND PLAN / ED COURSE  Pertinent labs & imaging results that were available during my care of the patient were reviewed by me and considered in my medical decision making (see chart for details).   DDX: ACS, pericarditis, esophagitis, boerhaaves, pe, dissection, pna, bronchitis, costochondritis   Sharon [place] is a 32 y.o. who presents to the ED with symptoms as described above.  Patient nontoxic-appearing.  Very likely musculoskeletal in nature but not having any obvious deformity or point tenderness to explain the patient's symptoms.  EKG is unremarkable.  Patient is low risk by Wells criteria but is pregnant therefore d-dimer will be sent as she does have pleuritic chest pain and shortness of breath to exclude PE.  We will also order concomitant lower extremity duplex to evaluate for DVT and further stratify for PE.  Her abdominal exam is soft and benign.  This does not seem to be related to acute intra-abdominal process.    ----------------------------------------- 7:21 AM on 12/02/2018 -----------------------------------------  D-dimer is negative.   Pain most likely musculoskeletal.  Ultrasound shows no evidence of DVT.  Chest x-ray without any evidence of pneumothorax.  As part of my medical decision making, I reviewed the following data within the electronic MEDICAL RECORD NUMBER Nursing notes reviewed and incorporated, Labs reviewed, notes from prior ED visits.  ____________________________________________   FINAL CLINICAL IMPRESSION(S) / ED DIAGNOSES  Final diagnoses:  Chest wall pain      NEW MEDICATIONS STARTED DURING THIS VISIT:  New Prescriptions   No medications on file     Note:  This document was prepared using Dragon voice recognition software and may include unintentional dictation errors.    Willy Eddyobinson, Edmon Magid, MD 12/02/18 [phone]

## 2018-12-02 NOTE — ED Notes (Signed)
Lab called for add on 

## 2018-12-02 NOTE — ED Triage Notes (Addendum)
Patient with complaint of pain to left upper back that radiated to her left chest. Patient states that she has had nausea and vomiting and that the pain is worse with taking a deep breath. Patient states that the pain started yesterday morning and has become worse throughout the day. Patient states that she has had similar pain in the past and has been seen but unable to diagnose her. Patient states that she is [redacted] weeks pregnant .

## 2018-12-06 ENCOUNTER — Telehealth: Payer: Self-pay | Admitting: Obstetrics and Gynecology

## 2018-12-06 NOTE — Telephone Encounter (Signed)
Pt called no answer and unable to leave a voicemail due to no voicemail has been set up.  

## 2018-12-06 NOTE — Telephone Encounter (Signed)
Spoke with pt and she is coming in at 9:45 on Monday Dec 09, 2018 to do her panorama redraw.

## 2018-12-06 NOTE — Telephone Encounter (Signed)
Message was sent to pt via mychart stating that I had looked at the website and noticed that she needed to do a redraw for panorama and asked pt to schedule that appointment sometime next week.

## 2018-12-06 NOTE — Telephone Encounter (Signed)
The patient is asking for her panorama results, please advise, thanks.

## 2018-12-09 ENCOUNTER — Encounter: Payer: Self-pay | Admitting: Obstetrics and Gynecology

## 2018-12-09 ENCOUNTER — Other Ambulatory Visit: Payer: Medicaid Other

## 2018-12-11 NOTE — L&D Delivery Note (Signed)
Delivery Summary for Sharon Salinas  Labor Events:   Preterm labor:   Rupture date: 06/22/2019  Rupture time: 10:34 PM  Rupture type: Spontaneous Bulging bag of water  Fluid Color: Green Moderate Meconium  Induction:   Augmentation:   Complications:   Cervical ripening:          Delivery:   Episiotomy:   Lacerations:   Repair suture:   Repair # of packets:   Blood loss (ml): 350   Information for the patient's newborn:  [name], [name] [[id number]]    Delivery 06/22/2019 11:55 PM by  Vaginal, Spontaneous Sex:  female Gestational Age: [redacted]w[redacted]d Delivery Clinician:   Living?:         APGARS  One minute Five minutes Ten minutes  Skin color:        Heart rate:        Grimace:        Muscle tone:        Breathing:        Totals: 7  9      Presentation/position:      Resuscitation:   Cord information:    Disposition of cord blood:     Blood gases sent?  Complications:   Placenta: Delivered:       appearance Newborn Measurements: Weight: 9 lb 11.6 oz (4410 g)  Height:    Head circumference:    Chest circumference:    Other providers:    Additional  information: Forceps:   Vacuum:   Breech:   Observed anomalies        Delivery Note At 11:55 PM a viable and healthy female was delivered via Vaginal, Spontaneous (Presentation: vertex; LOA position).  Labor was precipitous (~ 3 hrs from initial presentation in triage to delivery). APGAR: 7, 9; weight 4410 .   Placenta status: spontaneously removed, intact.  Cord: 3-vessel with the following complications: nuchal cord x 1, reduced after delivery of the fetus.  Cord pH: not obtained.  Anesthesia: Epidural (received just prior to delivery) Episiotomy: None Lacerations: None Suture Repair: None Est. Blood Loss (mL):  350  Mom to postpartum.  Baby to Couplet care / Skin to Skin.  Rubie Maid 06/23/2019, 12:10 AM

## 2018-12-17 ENCOUNTER — Encounter: Payer: Self-pay | Admitting: Obstetrics and Gynecology

## 2018-12-17 ENCOUNTER — Other Ambulatory Visit: Payer: Medicaid Other

## 2018-12-17 ENCOUNTER — Ambulatory Visit (INDEPENDENT_AMBULATORY_CARE_PROVIDER_SITE_OTHER): Payer: Medicaid Other | Admitting: Obstetrics and Gynecology

## 2018-12-17 VITALS — BP 115/78 | HR 82 | Wt 228.0 lb

## 2018-12-17 DIAGNOSIS — Z3492 Encounter for supervision of normal pregnancy, unspecified, second trimester: Secondary | ICD-10-CM

## 2018-12-17 DIAGNOSIS — O09292 Supervision of pregnancy with other poor reproductive or obstetric history, second trimester: Secondary | ICD-10-CM

## 2018-12-17 DIAGNOSIS — Z8632 Personal history of gestational diabetes: Principal | ICD-10-CM

## 2018-12-17 DIAGNOSIS — Z9889 Other specified postprocedural states: Secondary | ICD-10-CM

## 2018-12-17 DIAGNOSIS — O09299 Supervision of pregnancy with other poor reproductive or obstetric history, unspecified trimester: Secondary | ICD-10-CM

## 2018-12-17 DIAGNOSIS — O99212 Obesity complicating pregnancy, second trimester: Secondary | ICD-10-CM

## 2018-12-17 DIAGNOSIS — E668 Other obesity: Secondary | ICD-10-CM

## 2018-12-17 NOTE — Progress Notes (Signed)
Patient comes in today for OB visit. Patient declines flu shot and AFP testing. She states that she is having lower abdominal cramps when she gets out of the car or moves to fast.

## 2018-12-17 NOTE — Progress Notes (Signed)
ROB: Some nasal congestion-discussed vaporizer and Robitussin-DM.  Rd lig pain?  1 hour GCT today.  Patient considering AFP testing next visit.  FAS next visit.

## 2018-12-18 LAB — GLUCOSE, 1 HOUR GESTATIONAL: Gestational Diabetes Screen: 154 mg/dL — ABNORMAL HIGH (ref 65–139)

## 2018-12-20 ENCOUNTER — Other Ambulatory Visit: Payer: Medicaid Other

## 2018-12-20 ENCOUNTER — Other Ambulatory Visit: Payer: Self-pay | Admitting: Surgical

## 2018-12-20 DIAGNOSIS — O09299 Supervision of pregnancy with other poor reproductive or obstetric history, unspecified trimester: Secondary | ICD-10-CM

## 2018-12-20 DIAGNOSIS — Z8632 Personal history of gestational diabetes: Principal | ICD-10-CM

## 2018-12-21 LAB — GESTATIONAL GLUCOSE TOLERANCE
GLUCOSE 3 HOUR GTT: 91 mg/dL (ref 65–139)
Glucose, Fasting: 82 mg/dL (ref 65–94)
Glucose, GTT - 1 Hour: 165 mg/dL (ref 65–179)
Glucose, GTT - 2 Hour: 170 mg/dL — ABNORMAL HIGH (ref 65–154)

## 2018-12-24 ENCOUNTER — Telehealth: Payer: Self-pay

## 2018-12-24 ENCOUNTER — Telehealth: Payer: Self-pay | Admitting: Obstetrics and Gynecology

## 2018-12-24 NOTE — Telephone Encounter (Signed)
The patient called and stated that she would like to speak with Geraldo Pitter if possible today. She received a message to call the office today if possible. No other information was disclosed. Please advise.

## 2018-12-24 NOTE — Telephone Encounter (Signed)
Pt called no answer and no voicemail had been set up to leave a message.

## 2018-12-24 NOTE — Telephone Encounter (Signed)
Speak with pt and went over genetic testing. Pt is aware of genetic testing the sex of her baby.

## 2019-01-14 ENCOUNTER — Ambulatory Visit (INDEPENDENT_AMBULATORY_CARE_PROVIDER_SITE_OTHER): Payer: Medicaid Other

## 2019-01-14 ENCOUNTER — Ambulatory Visit (INDEPENDENT_AMBULATORY_CARE_PROVIDER_SITE_OTHER): Payer: Medicaid Other | Admitting: Obstetrics and Gynecology

## 2019-01-14 ENCOUNTER — Other Ambulatory Visit: Payer: Medicaid Other

## 2019-01-14 ENCOUNTER — Other Ambulatory Visit: Payer: Self-pay | Admitting: Obstetrics and Gynecology

## 2019-01-14 VITALS — BP 113/77 | HR 61 | Wt 226.8 lb

## 2019-01-14 DIAGNOSIS — IMO0001 Reserved for inherently not codable concepts without codable children: Secondary | ICD-10-CM

## 2019-01-14 DIAGNOSIS — O358XX0 Maternal care for other (suspected) fetal abnormality and damage, not applicable or unspecified: Secondary | ICD-10-CM

## 2019-01-14 DIAGNOSIS — Z3492 Encounter for supervision of normal pregnancy, unspecified, second trimester: Secondary | ICD-10-CM

## 2019-01-14 DIAGNOSIS — O26892 Other specified pregnancy related conditions, second trimester: Secondary | ICD-10-CM

## 2019-01-14 DIAGNOSIS — Z363 Encounter for antenatal screening for malformations: Secondary | ICD-10-CM

## 2019-01-14 DIAGNOSIS — Z3482 Encounter for supervision of other normal pregnancy, second trimester: Secondary | ICD-10-CM

## 2019-01-14 DIAGNOSIS — O2612 Low weight gain in pregnancy, second trimester: Secondary | ICD-10-CM

## 2019-01-14 DIAGNOSIS — N898 Other specified noninflammatory disorders of vagina: Secondary | ICD-10-CM

## 2019-01-14 DIAGNOSIS — IMO0002 Reserved for concepts with insufficient information to code with codable children: Secondary | ICD-10-CM | POA: Insufficient documentation

## 2019-01-14 DIAGNOSIS — O99810 Abnormal glucose complicating pregnancy: Secondary | ICD-10-CM | POA: Insufficient documentation

## 2019-01-14 LAB — POCT URINALYSIS DIPSTICK OB
Bilirubin, UA: NEGATIVE
Blood, UA: NEGATIVE
Glucose, UA: NEGATIVE
Ketones, UA: NEGATIVE
Leukocytes, UA: NEGATIVE
NITRITE UA: NEGATIVE
PROTEIN: NEGATIVE
SPEC GRAV UA: 1.01 (ref 1.010–1.025)
Urobilinogen, UA: 0.2 E.U./dL
pH, UA: 7.5 (ref 5.0–8.0)

## 2019-01-14 NOTE — Progress Notes (Signed)
ROB-Pt stated that she has noticed thick yellow discharge. Pt stated other than that she is doing well. No complaints.

## 2019-01-14 NOTE — Progress Notes (Addendum)
ROB: Patient complains of yellow discharge for the past several days. Denies itching or burning. Wet prep done, negative. Given reassurance.  Reviewed anatomy scan, LV of heart with EIF.  Will repeat in 4 weeks. AFP done today. Patient concerned about weight loss.  Ultrasound notes normal fetal size currently. Given reassurance, advised on increasing protein, drink whole milk. RTC in 4 weeks.

## 2019-01-14 NOTE — Addendum Note (Signed)
Addended by: Elonda Husky on: 01/14/2019 09:21 AM   Modules accepted: Orders

## 2019-01-16 LAB — AFP, SERUM, OPEN SPINA BIFIDA
AFP MoM: 0.54
AFP Value: 18 ng/mL
Gest. Age on Collection Date: 18 weeks
Maternal Age At EDD: 31.3 yr
OSBR Risk 1 IN: 10000
Test Results:: NEGATIVE
Weight: 228 [lb_av]

## 2019-02-04 ENCOUNTER — Ambulatory Visit (INDEPENDENT_AMBULATORY_CARE_PROVIDER_SITE_OTHER): Payer: Medicaid Other | Admitting: Obstetrics and Gynecology

## 2019-02-04 ENCOUNTER — Encounter: Payer: Self-pay | Admitting: Obstetrics and Gynecology

## 2019-02-04 VITALS — BP 108/59 | HR 84 | Wt 233.0 lb

## 2019-02-04 DIAGNOSIS — B009 Herpesviral infection, unspecified: Secondary | ICD-10-CM

## 2019-02-04 DIAGNOSIS — N898 Other specified noninflammatory disorders of vagina: Secondary | ICD-10-CM

## 2019-02-04 LAB — POCT URINALYSIS DIPSTICK OB
Bilirubin, UA: NEGATIVE
Blood, UA: NEGATIVE
GLUCOSE, UA: NEGATIVE
Ketones, UA: NEGATIVE
Leukocytes, UA: NEGATIVE
Nitrite, UA: NEGATIVE
POC,PROTEIN,UA: NEGATIVE
Spec Grav, UA: 1.01 (ref 1.010–1.025)
Urobilinogen, UA: 0.2 E.U./dL
pH, UA: 6.5 (ref 5.0–8.0)

## 2019-02-04 MED ORDER — VALACYCLOVIR HCL 1 G PO TABS: 1000.0000 mg | ORAL_TABLET | Freq: Two times a day (BID) | ORAL | 3 refills | Status: DC

## 2019-02-04 NOTE — Progress Notes (Signed)
HPI:      Ms. Synthia Nicole [place] is a 33 y.o. [phone] who LMP was Patient's last menstrual period was 08/31/2018 (exact date).  Subjective:   She presents today with complaints of a vaginal sore that burns with urination.  She says she began feeling a tingling sensation on Friday and then on Saturday noticed a specific sore area just inside the labia.  She says she has never had anything like this before.  She denies any history of HSV.  Her partner denies any history of HSV.    Hx: The following portions of the patient's history were reviewed and updated as appropriate:             She  has a past medical history of History of abnormal cervical Pap smear (2016) and Obesity (BMI 30-39.9). She does not have any pertinent problems on file. She  has a past surgical history that includes LEEP (2016) and Wisdom tooth extraction. Her family history includes Cancer in her mother; Hyperlipidemia in her father; Seizures in her mother. She  reports that she has never smoked. She has never used smokeless tobacco. She reports previous alcohol use. She reports that she does not use drugs. She has a current medication list which includes the following prescription(s): acetaminophen, prenatal multivitamin, and valacyclovir. She has No Known Allergies.       Review of Systems:  Review of Systems  Constitutional: Denied constitutional symptoms, night sweats, recent illness, fatigue, fever, insomnia and weight loss.  Eyes: Denied eye symptoms, eye pain, photophobia, vision change and visual disturbance.  Ears/Nose/Throat/Neck: Denied ear, nose, throat or neck symptoms, hearing loss, nasal discharge, sinus congestion and sore throat.  Cardiovascular: Denied cardiovascular symptoms, arrhythmia, chest pain/pressure, edema, exercise intolerance, orthopnea and palpitations.  Respiratory: Denied pulmonary symptoms, asthma, pleuritic pain, productive sputum, cough, dyspnea and wheezing.  Gastrointestinal:  Denied, gastro-esophageal reflux, melena, nausea and vomiting.  Genitourinary: See HPI for additional information.  Musculoskeletal: Denied musculoskeletal symptoms, stiffness, swelling, muscle weakness and myalgia.  Dermatologic: Denied dermatology symptoms, rash and scar.  Neurologic: Denied neurology symptoms, dizziness, headache, neck pain and syncope.  Psychiatric: Denied psychiatric symptoms, anxiety and depression.  Endocrine: Denied endocrine symptoms including hot flashes and night sweats.   Meds:   Current Outpatient Medications on File Prior to Visit  Medication Sig Dispense Refill  . acetaminophen (TYLENOL) 500 MG tablet Take 500 mg by mouth every 6 (six) hours as needed.    . Prenatal Vit-Fe Fumarate-FA (PRENATAL MULTIVITAMIN) TABS tablet Take 1 tablet by mouth daily at 12 noon.     No current facility-administered medications on file prior to visit.     Objective:     Vitals:   02/04/19 0843  BP: (!) 108/59  Pulse: 84              Vulvar examination reveals 3 very small ulcerated clustered lesions just inside the right labia minus.  These are sore when touched with a Q-tip.  There are no other abnormalities of the vulva or vagina noted.  Assessment:    [id number] Patient Active Problem List   Diagnosis Date Noted  . Abnormal glucose tolerance in pregnancy 01/14/2019  . Fetal cardiac echogenic focus 01/14/2019  . Poor weight gain of pregnancy, second trimester 01/14/2019  . History of gestational diabetes in prior pregnancy, currently pregnant 11/19/2018  . Mild obesity 11/19/2018  . History of loop electrical excision procedure (LEEP) 11/19/2018  . Pregnancy 10/31/2018     1. Herpes  2. Vaginal sore     Based on appearance and current symptoms likely type I.   Plan:            1.  HSV cultures performed will await results.  2.  Will presumptively treat with Valtrex.  Treatment during pregnancy in the third trimester discussed in detail if it should  turn out to be HSV.  HSV  I have discussed Herpes in detail with the patient.  Both HSV Type I and II were reviewed.  The sexually transmitted nature of HSV I and II was discussed.  The natural course and history of Herpes was discussed with the patient.  Management of repeat outbreaks, including the use of Anti-viral agents for prophylaxis, as well as intermittent treatment, was discussed.  The transmission of Herpes was discussed and I informed her that Herpes could be transmitted at any time, but the most likely time of transmission was when the patient had lesions or noticed prodromal symptoms.  Literature regarding Herpes was provided to the patient.  All of her questions have been answered and I believe she has an adequate understanding of Genital Herpes. Herpes in Pregnancy I have discussed Herpes during pregnancy with the patient.  I have informed her that recurrent herpes is less likely to be transmitted to the fetus than the Primary episode.  I have informed her that should she develop an outbreak during the pregnancy, it will most likely not harm the baby because of the presence of maternal antibodies.  I have discussed the possibility of outbreak during labor or at the time of spontaneous rupture of membranes.  I have informed the patient to call the office or L&D immediately should she have an outbreak at the time of spontaneous rupture of membranes, or the beginning of labor.  She understands that a Cesarean delivery may be necessary should she have the prodrome at this time or have lesions present.  I have stressed the importance of calling immediately and we have discussed the amount of time that is felt to be effective in reducing the risk of transmission of Herpes to the infant during delivery.  Orders Orders Placed This Encounter  Procedures  . Herpes simplex virus culture  . POC Urinalysis Dipstick OB     Meds ordered this encounter  Medications  . valACYclovir (VALTREX) 1000 MG  tablet    Sig: Take 1 tablet (1,000 mg total) by mouth 2 (two) times daily. Take for ten days.    Dispense:  20 tablet    Refill:  3      F/U  Return in about 1 week (around 02/11/2019) for As scheduled. I spent 19 minutes involved in the care of this patient of which greater than 50% was spent discussing HSV as a possible diagnosis.  Natural course and history of HSV, HSV in pregnancy.  See above. All questions answered.  Elonda Husky, M.D. 02/04/2019 10:02 AM

## 2019-02-04 NOTE — Progress Notes (Signed)
Patient comes in today for a cyst in the vaginal area. Patient first noticed it Friday. It burst on Saturday.

## 2019-02-09 LAB — HERPES SIMPLEX VIRUS CULTURE

## 2019-02-11 ENCOUNTER — Other Ambulatory Visit: Payer: Self-pay | Admitting: Obstetrics and Gynecology

## 2019-02-11 DIAGNOSIS — Z3689 Encounter for other specified antenatal screening: Secondary | ICD-10-CM

## 2019-02-11 DIAGNOSIS — Z3492 Encounter for supervision of normal pregnancy, unspecified, second trimester: Secondary | ICD-10-CM

## 2019-02-12 ENCOUNTER — Ambulatory Visit (INDEPENDENT_AMBULATORY_CARE_PROVIDER_SITE_OTHER): Payer: Medicaid Other | Admitting: Obstetrics and Gynecology

## 2019-02-12 ENCOUNTER — Other Ambulatory Visit: Payer: Medicaid Other

## 2019-02-12 ENCOUNTER — Ambulatory Visit: Admission: RE | Admit: 2019-02-12 | Payer: Medicaid Other | Source: Ambulatory Visit

## 2019-02-12 ENCOUNTER — Encounter: Payer: Self-pay | Admitting: Obstetrics and Gynecology

## 2019-02-12 VITALS — BP 102/65 | HR 83 | Wt 237.0 lb

## 2019-02-12 DIAGNOSIS — B009 Herpesviral infection, unspecified: Secondary | ICD-10-CM

## 2019-02-12 DIAGNOSIS — Z3482 Encounter for supervision of other normal pregnancy, second trimester: Secondary | ICD-10-CM

## 2019-02-12 DIAGNOSIS — IMO0001 Reserved for inherently not codable concepts without codable children: Secondary | ICD-10-CM

## 2019-02-12 DIAGNOSIS — O358XX0 Maternal care for other (suspected) fetal abnormality and damage, not applicable or unspecified: Secondary | ICD-10-CM

## 2019-02-12 LAB — POCT URINALYSIS DIPSTICK OB
Bilirubin, UA: NEGATIVE
Blood, UA: NEGATIVE
Glucose, UA: NEGATIVE
Leukocytes, UA: NEGATIVE
Nitrite, UA: NEGATIVE
POC,PROTEIN,UA: NEGATIVE
Spec Grav, UA: 1.01 (ref 1.010–1.025)
Urobilinogen, UA: 0.2 E.U./dL
pH, UA: 7 (ref 5.0–8.0)

## 2019-02-12 NOTE — Progress Notes (Signed)
Patient comes in today for ROB. Patient has no concerns today.

## 2019-02-12 NOTE — Progress Notes (Signed)
ROB: Patient says lesion has completely resolved.  HSV cultures negative.  Will order antibody testing today.  Patient scheduled for follow-up ultrasound for intracardiac focus.

## 2019-02-13 ENCOUNTER — Ambulatory Visit
Admission: RE | Admit: 2019-02-13 | Discharge: 2019-02-13 | Disposition: A | Discharge status: Home / Self Care | Payer: Medicaid Other | Source: Ambulatory Visit | Attending: Obstetrics and Gynecology | Admitting: Obstetrics and Gynecology

## 2019-02-13 DIAGNOSIS — Z3492 Encounter for supervision of normal pregnancy, unspecified, second trimester: Secondary | ICD-10-CM | POA: Diagnosis present

## 2019-02-13 LAB — HSV TYPE I/II IGG, IGMW/ REFLEX
HSV 1 Glycoprotein G Ab, IgG: <0.91 index (ref 0.00–0.90)
HSV 2 IgG, Type Spec: 15.5 index — ABNORMAL HIGH (ref 0.00–0.90)
HSV 2 IgM: <1:10 {titer}

## 2019-02-20 ENCOUNTER — Telehealth: Payer: Self-pay | Admitting: Obstetrics and Gynecology

## 2019-02-20 NOTE — Telephone Encounter (Signed)
The patient called and stated that she would like to speak with her nurse in regards to her wanting to discuss her test results. No other information was disclosed. Please advise.

## 2019-02-20 NOTE — Telephone Encounter (Signed)
Please advise on results

## 2019-02-20 NOTE — Telephone Encounter (Signed)
LM for patient to return call.

## 2019-02-26 NOTE — Telephone Encounter (Signed)
LM for patient to return call.

## 2019-03-13 ENCOUNTER — Telehealth: Payer: Self-pay

## 2019-03-13 NOTE — Telephone Encounter (Signed)
Pt called no answer LM via voicemail to call the office to go over COVID-19 questions before her visit tomorrow.  

## 2019-03-13 NOTE — Telephone Encounter (Signed)
Pt called back and went over COVID-19 questions before her visit to the office tomorrow. Pt stated that she is not having any SOB, cough, difficulty breathing, gastrointestinal issues. Pt is aware that there are not visitors in the office. Pt voiced that she understood.

## 2019-03-14 ENCOUNTER — Encounter: Payer: Self-pay | Admitting: Obstetrics and Gynecology

## 2019-03-14 ENCOUNTER — Other Ambulatory Visit: Payer: Self-pay

## 2019-03-14 ENCOUNTER — Ambulatory Visit (INDEPENDENT_AMBULATORY_CARE_PROVIDER_SITE_OTHER): Payer: Medicaid Other | Admitting: Obstetrics and Gynecology

## 2019-03-14 VITALS — BP 108/64 | HR 82 | Wt 237.7 lb

## 2019-03-14 DIAGNOSIS — O09299 Supervision of pregnancy with other poor reproductive or obstetric history, unspecified trimester: Secondary | ICD-10-CM

## 2019-03-14 DIAGNOSIS — R768 Other specified abnormal immunological findings in serum: Secondary | ICD-10-CM

## 2019-03-14 DIAGNOSIS — Z8632 Personal history of gestational diabetes: Secondary | ICD-10-CM

## 2019-03-14 DIAGNOSIS — Z131 Encounter for screening for diabetes mellitus: Secondary | ICD-10-CM

## 2019-03-14 DIAGNOSIS — O09292 Supervision of pregnancy with other poor reproductive or obstetric history, second trimester: Secondary | ICD-10-CM

## 2019-03-14 DIAGNOSIS — Z130 Encounter for screening for diseases of the blood and blood-forming organs and certain disorders involving the immune mechanism: Secondary | ICD-10-CM

## 2019-03-14 DIAGNOSIS — Z3482 Encounter for supervision of other normal pregnancy, second trimester: Secondary | ICD-10-CM

## 2019-03-14 LAB — POCT URINALYSIS DIPSTICK OB
Bilirubin, UA: NEGATIVE
Blood, UA: NEGATIVE
Glucose, UA: NEGATIVE
Ketones, UA: NEGATIVE
Leukocytes, UA: NEGATIVE
Nitrite, UA: NEGATIVE
Spec Grav, UA: 1.01 (ref 1.010–1.025)
Urobilinogen, UA: 0.2 E.U./dL
pH, UA: 7.5 (ref 5.0–8.0)

## 2019-03-14 NOTE — Progress Notes (Signed)
ROB: Doing well, no complaints.  Discussed need for repeat 3 hr GTT in third trimester due to abnormal early 1 hr with normal 3 hr. Will schedule. 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  Also will need HSV prophylaxis at 36 weeks.  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  Advised to take precautions (wear gloves, use hand sanitizer, don't go into care for sick patients) and if ever concerned, patient is able to be taken out of work (can file her FMLA per patient). Discussed COVID hospital policy and pregnancy. RTC in 3 weeks.

## 2019-03-14 NOTE — Progress Notes (Signed)
ROB-Pt present today for prenatal care. Pt stated that she was doing well. Pt stated fetal movement present, no pressure or pain. No contractions or vaginal bleeding.

## 2019-03-14 NOTE — Patient Instructions (Signed)
                                                                                                                 FREQUENTLY ASKED QUESTIONS FOR OBSTETRICS/PEDIATRICS    Q: Why are visitor restrictions different for maternity care areas?  [place] is restricting visitors for the duration of the patient's hospitalization. The birth of a child involves the mother, considered the patient, and a birthing partner. These are unprecedented times and we are making the exception to allow a birthing partner to be a part of the patient unit. No other guests will be allowed in our [facility] Center at [facility] and at [facility].   Q: Are credentialed doulas allowed to support their existing patients?  We acknowledge the value these doula partnerships offer our care teams and many birthing families in our communities. Each laboring mother is allowed one birthing partner of the patient's choosing for her entire hospitalization.   Q: Are visitor restrictions different for hospitalized children?  Pediatric patients (infants and children under 17 years of age), such as those in the Children's Unit, Pediatric ICU and NICU, will be allowed two visitors (parents or legal guardians)   Q: Are pregnant women at an increased risk for COVID-19?  The American College of Obstetricians and Gynecologists (ACOG) is monitoring closely the coronavirus pandemic. With the limited information available, data does not indicate pregnant women are at an increased risk. However, pregnant women are known to be at greater risk for respiratory infections like flu. With that in mind, expectant mothers are considered an at-risk population for COVID-19, according to ACOG.   Q: Are newborns at an increased risk for COVID-19?  A limited sample of COVID-19 data with newborns indicates the virus is not transferred to the infant during pregnancy. However, postpartum separation is recommended by the Centers for  Disease Control (CDC). As a result [place] recommends and strongly encourages temporary separation of moms and babies who test positive for COVID-19 or are awaiting results to rule out COVID-19 based on CDC guidelines.   Q: If you have a suspected case of COVID-19, is the NICU couplet care room an option?  No. If either patient is considered at-risk for having COVID-19, the Women[facility] Center at [facility] will not use the NICU couplet care rooms for that family.   Q: [place] is urging that elective procedures be postponed. What is considered elective for women's and children's service line?  NOT ELECTIVE: Obstetric procedures, even those with an element of choice on timing, are not considered elective. Circumcisions are considered elective procedures, however, these do not deplete blood products and other resources, which is the spirit in which the COVID-19 postponement of elective procedures was intended. Therefore, circumcisions will be allowed.   ELECTIVE: Postpartum tubal ligations are considered elective and should be postponed. Q&A for Obstetricians, Gynecologists and Pediatricians  Published February 28, 2019   [place] supports as much as possible the medical care   team working with the patient's individual needs to address timing during these unprecedented times. We seek the support of our medical care team in preserving needed resources throughout our crisis response to COVID-19.   Q: How does COVID-19 impact breastfeeding?  Breastmilk is safe for your baby - even if the mother has tested positive for COVID-19. If a COVID-19+ mother decides to breastfeed while inpatient and after discharge, we suggest proper protective equipment be worn and hand hygiene be performed before and after feeding the infant. The new mother also has the option to pump her milk and have a healthy family member feed the baby to protect the baby from getting the virus.   Q: Should we urge  patients to avoid baby showers and large gatherings?  Yes. As has been recommended for all citizens in our communities, gatherings of 10 or more should be avoided - pregnant or not. Seek creative options for "hosting" baby showers through electronic means that honor the request for social distancing during this time of heightened awareness.   Q: Should patients miss their prenatal appointments?  No. Prenatal visits are NOT elective. While we want to limit contact and exposure, prenatal care is vital right now. Contact your physician's office if you have concerns about your visits. We are limiting outpatient office visits to the patient and one guest in order to reduce the potential for exposure.   Q: What if a pregnant woman feels sick? Should she miss her prenatal visit then?  A pregnant woman experiencing coronavirus-like symptoms (i.e., cough, fever, difficulty breathing, shortness of breath, gastrointestinal issues) should contact her pregnancy care provider by phone. Her medical professional can best determine whether she should use a video visit or possibly go to a collection site to be tested for COVID-19. Contacting her primary care provider or her pregnancy care provider is her first step.   Q: What can I do about childbirth education? All the classes are cancelled.  The [facility] Centers will offer online learning to support mothers on their journey. We currently offer Understanding Childbirth, Understanding Breastfeeding and Understanding Newborn Care as an online class. Please visit our website, [email or web address], to register for an online class.   Q: How can I keep from getting COVID-19? Q&A for Obstetricians, Gynecologists and Pediatricians  Published February 28, 2019   Together, we can reduce the risk of exposure to the virus and help you and your family remain healthy and safe. One of the best ways to protect yourself is to wash your hands frequently using soap and  water. Also, you should avoid touching your eyes, nose and mouth with unwashed hands, avoid physical contact with others and practice social distancing.   Q: How are employees being informed about what to do?  [facility] leaders receive a daily COVID-19 update and share relevant information with their teams. This is a time when health care professionals are called on to lead within our community. We appreciate our staff's engagement with our COVID-19 updates and encourage them to share best practices on reducing the spread of the virus with our patients and community. We are prepared to provide the exceptional COVID-19 care and coordination our community needs, expects and deserves.   Q: Who's in charge of this issue at ?  The leadership structure and process established to address COVID-19 includes Chief Physician Executive Bruce Swords, MD; Infection Prevention Medical Director Cynthia Snider, MD; and Infection Prevention Interim Director Sara Wall, MSN, RN, CIC, CSPDT. A team   of [place] experts reflecting a broad spectrum of our workforce is meeting daily to evaluate new information we receive about COVID-19 and to adapt policies and practices accordingly.                         Published February 28, 2019    

## 2019-04-03 ENCOUNTER — Telehealth: Payer: Self-pay

## 2019-04-03 NOTE — Telephone Encounter (Signed)
Pt called prescreened for COVID-19. Pt denies having any symptoms related to COVID-19. Pt is aware of the no visitors policy. Pt is aware of her appointment time.

## 2019-04-04 ENCOUNTER — Ambulatory Visit (INDEPENDENT_AMBULATORY_CARE_PROVIDER_SITE_OTHER): Payer: Medicaid Other | Admitting: Obstetrics and Gynecology

## 2019-04-04 ENCOUNTER — Encounter: Payer: Self-pay | Admitting: Obstetrics and Gynecology

## 2019-04-04 ENCOUNTER — Other Ambulatory Visit: Payer: Medicaid Other

## 2019-04-04 ENCOUNTER — Other Ambulatory Visit: Payer: Self-pay

## 2019-04-04 VITALS — BP 100/64 | HR 92 | Ht 69.0 in | Wt 242.1 lb

## 2019-04-04 DIAGNOSIS — Z8632 Personal history of gestational diabetes: Secondary | ICD-10-CM

## 2019-04-04 DIAGNOSIS — Z23 Encounter for immunization: Secondary | ICD-10-CM | POA: Diagnosis not present

## 2019-04-04 DIAGNOSIS — Z130 Encounter for screening for diseases of the blood and blood-forming organs and certain disorders involving the immune mechanism: Secondary | ICD-10-CM

## 2019-04-04 DIAGNOSIS — Z3482 Encounter for supervision of other normal pregnancy, second trimester: Secondary | ICD-10-CM

## 2019-04-04 DIAGNOSIS — O09299 Supervision of pregnancy with other poor reproductive or obstetric history, unspecified trimester: Secondary | ICD-10-CM

## 2019-04-04 LAB — POCT URINALYSIS DIPSTICK OB
Bilirubin, UA: NEGATIVE
Blood, UA: NEGATIVE
Ketones, UA: NEGATIVE
Leukocytes, UA: NEGATIVE
Nitrite, UA: NEGATIVE
Spec Grav, UA: 1.01 (ref 1.010–1.025)
Urobilinogen, UA: 0.2 E.U./dL
pH, UA: 7 (ref 5.0–8.0)

## 2019-04-04 MED ORDER — TETANUS-DIPHTH-ACELL PERTUSSIS 5-2.5-18.5 LF-MCG/0.5 IM SUSP
0.5000 mL | Freq: Once | INTRAMUSCULAR | Status: AC
  Administered 2019-04-04: 0.5 mL via INTRAMUSCULAR

## 2019-04-04 NOTE — Progress Notes (Signed)
Patient comes in today for ROB. She said that when she walks around the grocery store she gets SOB and dizzy. When she is sitting down she is fine.

## 2019-04-04 NOTE — Progress Notes (Signed)
ROB: Doing her 3-hour GTT today.  COVID-19 discussed.  Patient generally doing well.  Occasional lightheadedness discussed in detail- possible hypoglycemia.

## 2019-04-04 NOTE — Patient Instructions (Signed)
Q: Why are visitor restrictions different for maternity care areas? [place] is restricting visitors for the duration of the patient's hospitalization. The birth of a child involves the mother, considered the patient, and a birthing partner. These are unprecedented times and we are making the exception to allow a birthing partner to be a part of the patient unit. No other guests will be allowed in our [facility] at [facility] and at  [facility].  Q: Are credentialed doulas allowed to support their existing patients? We acknowledge the value these doula partnerships offer our care teams and many birthing families in our communities. Each laboring mother is allowed one birthing partner of the patient's choosing for her entire hospitalization.  Q: Are visitor restrictions different for hospitalized children? Pediatric patients (infants and children under 17 years of age), such as those in the Children's Unit, Pediatric ICU and NICU, will be allowed two visitors (parents or legal guardians)  Q: Are pregnant women at an increased risk for COVID-19? The American College of Obstetricians and Gynecologists (ACOG) is monitoring closely the coronavirus pandemic. With the limited information available, data does not indicate pregnant women are at an increased risk. However, pregnant women are known to be at greater risk for respiratory infections like flu. With that in mind, expectant mothers are considered an at-risk population for COVID-19, according to ACOG.  Q: Are newborns at an increased risk for COVID-19? A limited sample of COVID-19 data with newborns indicates the virus is not transferred to the infant during pregnancy. However, postpartum separation is recommended by the Centers for Disease Control (CDC). As a result [place] recommends and strongly encourages temporary separation of moms and babies who test positive for COVID-19 or are awaiting results to rule out  COVID-19 based on CDC guidelines.  Q: If you have a suspected case of COVID-19, is the NICU couplet care room an option? No. If either patient is considered at-risk for having COVID-19, the [facility] Center at [facility] will not use the NICU couplet care rooms for that family.  Q: [place] is urging that elective procedures be postponed. What is considered elective for women's and children's service line? NOT ELECTIVE: Obstetric procedures, even those with an element of choice on timing, are not considered elective. Circumcisions are considered elective procedures, however, these do not deplete blood products and other resources, which is the spirit in which the COVID-19 postponement of elective procedures was intended. Therefore, circumcisions will be allowed.  ELECTIVE: Postpartum tubal ligations are considered elective and should be postponed.   supports as much as possible the medical care team working with the patient's individual needs to address timing during these unprecedented times. We seek the support of our medical care team in preserving needed resources throughout our crisis response to COVID-19.  Q: How does COVID-19 impact breastfeeding? Breastmilk is safe for your baby - even if the mother has tested positive for COVID-19. We suggest the mother pump her milk and have a healthy family member feed the baby to protect the baby from getting the virus.  If a COVID-19+ mother decides to breastfeed after discharge, we suggest proper protective equipment be worn and hand hygiene be performed before and after feeding the infant.  Q: Should we urge patients to avoid baby showers and large gatherings? Yes. As has been recommended for all citizens in our communities, gatherings of 10 or more should be avoided - pregnant or not. Seek creative options   for "hosting" baby showers through electronic means that honor the request for social distancing during this  time of heightened awareness.  Q: Should patients miss their prenatal appointments? No. Prenatal visits are NOT elective. While we want to limit contact and exposure, prenatal care is vital right now. Contact your physician's office if you have concerns about your visits. We are limiting outpatient office visits to the patient and one guest in order to reduce the potential for exposure.  Q: What if a pregnant woman feels sick? Should she miss her prenatal visit then? A pregnant woman experiencing coronavirus-like symptoms (i.e., cough, fever, difficulty breathing, shortness of breath, gastrointestinal issues) should contact her pregnancy care provider by phone. Her medical professional can best determine whether she should use a video visit or possibly go to a collection site to be tested for COVID-19. Contacting her primary care provider or her pregnancy care provider is her first step.  Q: What can I do about childbirth education? All the classes are cancelled. The [facility] Centers will offer online learning to support mothers on their journey.  We currently offer Understanding Childbirth, Understanding Breastfeeding and Understanding Newborn Care as an online class.  Please visit our website, [email or web address], to register for an online class.  Q: How can I keep from getting COVID-19? Together, we can reduce the risk of exposure to the virus and help you and your family remain healthy and safe. One of the best ways to protect yourself is to wash your hands frequently using soap and water. Also, you should avoid touching your eyes, nose and mouth with unwashed hands, avoid physical contact with others and practice social distancing.   

## 2019-04-05 LAB — CBC
Hematocrit: 31.9 % — ABNORMAL LOW (ref 34.0–46.6)
Hemoglobin: 10.7 g/dL — ABNORMAL LOW (ref 11.1–15.9)
MCH: 28.7 pg (ref 26.6–33.0)
MCHC: 33.5 g/dL (ref 31.5–35.7)
MCV: 86 fL (ref 79–97)
Platelets: 242 10*3/uL (ref 150–450)
RBC: 3.73 x10E6/uL — ABNORMAL LOW (ref 3.77–5.28)
RDW: 12.1 % (ref 11.7–15.4)
WBC: 10.4 10*3/uL (ref 3.4–10.8)

## 2019-04-05 LAB — GESTATIONAL GLUCOSE TOLERANCE
Glucose, Fasting: 88 mg/dL (ref 65–94)
Glucose, GTT - 1 Hour: 164 mg/dL (ref 65–179)
Glucose, GTT - 2 Hour: 127 mg/dL (ref 65–154)
Glucose, GTT - 3 Hour: 119 mg/dL (ref 65–139)

## 2019-04-05 LAB — RPR: RPR Ser Ql: NONREACTIVE

## 2019-05-01 ENCOUNTER — Ambulatory Visit (INDEPENDENT_AMBULATORY_CARE_PROVIDER_SITE_OTHER): Payer: Medicaid Other | Admitting: Obstetrics and Gynecology

## 2019-05-01 ENCOUNTER — Encounter: Payer: Self-pay | Admitting: Obstetrics and Gynecology

## 2019-05-01 ENCOUNTER — Other Ambulatory Visit: Payer: Self-pay

## 2019-05-01 VITALS — BP 103/70 | HR 89 | Wt 244.8 lb

## 2019-05-01 DIAGNOSIS — O26893 Other specified pregnancy related conditions, third trimester: Secondary | ICD-10-CM

## 2019-05-01 DIAGNOSIS — R55 Syncope and collapse: Secondary | ICD-10-CM

## 2019-05-01 DIAGNOSIS — Z3483 Encounter for supervision of other normal pregnancy, third trimester: Secondary | ICD-10-CM

## 2019-05-01 DIAGNOSIS — O26843 Uterine size-date discrepancy, third trimester: Secondary | ICD-10-CM

## 2019-05-01 LAB — POCT URINALYSIS DIPSTICK OB
Bilirubin, UA: NEGATIVE
Blood, UA: NEGATIVE
Glucose, UA: NEGATIVE
Ketones, UA: NEGATIVE
Leukocytes, UA: NEGATIVE
Nitrite, UA: NEGATIVE
Spec Grav, UA: 1.025 (ref 1.010–1.025)
Urobilinogen, UA: 0.2 E.U./dL
pH, UA: 6.5 (ref 5.0–8.0)

## 2019-05-01 NOTE — Progress Notes (Signed)
ROB-Pt present today for prenatal care. Pt stated that she feels the baby moving, no pressure or pain, no contractions and no vaginal bleeding.

## 2019-05-01 NOTE — Patient Instructions (Signed)

## 2019-05-01 NOTE — Progress Notes (Signed)
ROB: Patient complains of episodes of feeling warm, flushed and light-headed like she is going to pass out. Notes she was told last visit that it may be her blood sugars, however patient notes she has been checking them recently and they have all been normal. BPs noted to be on lower side of normal, encouraged increasing hydration (especially with electrolyte fluids like Gatorade) and increasing sodium snacks like pretzels or lightly salted chips. Size>dates today, will f/u at next visit and order growth scan if indicated. Desires to breastfeed. Unsure of contraceptive desires, handout given. RTC in 2 weeks.

## 2019-05-15 ENCOUNTER — Ambulatory Visit (INDEPENDENT_AMBULATORY_CARE_PROVIDER_SITE_OTHER): Payer: Medicaid Other | Admitting: Obstetrics and Gynecology

## 2019-05-15 ENCOUNTER — Encounter: Payer: Self-pay | Admitting: Obstetrics and Gynecology

## 2019-05-15 ENCOUNTER — Other Ambulatory Visit: Payer: Self-pay

## 2019-05-15 VITALS — BP 118/70 | HR 94 | Ht 68.0 in | Wt 248.5 lb

## 2019-05-15 DIAGNOSIS — Z3483 Encounter for supervision of other normal pregnancy, third trimester: Secondary | ICD-10-CM

## 2019-05-15 DIAGNOSIS — O26843 Uterine size-date discrepancy, third trimester: Secondary | ICD-10-CM

## 2019-05-15 DIAGNOSIS — R768 Other specified abnormal immunological findings in serum: Secondary | ICD-10-CM

## 2019-05-15 MED ORDER — ACYCLOVIR 400 MG PO TABS: 400.0000 mg | ORAL_TABLET | Freq: Two times a day (BID) | ORAL | 3 refills | Status: DC

## 2019-05-15 NOTE — Progress Notes (Signed)
Patient comes in today for ROB visit. No concerns today. 

## 2019-05-15 NOTE — Progress Notes (Signed)
ROB: Patient without complaint.  Continues to measure size greater than dates-ultrasound ordered.  GBS-GC/chlamydia performed today.  HSV prophylaxis prescribed patient to begin next week.  Patient not having any further issues with lightheadedness.

## 2019-05-16 ENCOUNTER — Ambulatory Visit (INDEPENDENT_AMBULATORY_CARE_PROVIDER_SITE_OTHER): Payer: Medicaid Other

## 2019-05-16 DIAGNOSIS — Z362 Encounter for other antenatal screening follow-up: Secondary | ICD-10-CM

## 2019-05-16 DIAGNOSIS — O26843 Uterine size-date discrepancy, third trimester: Secondary | ICD-10-CM

## 2019-05-17 LAB — STREP GP B NAA: Strep Gp B NAA: NEGATIVE

## 2019-05-20 ENCOUNTER — Telehealth: Payer: Self-pay

## 2019-05-20 NOTE — Progress Notes (Signed)
ROB-Pt present today for prenatal care. Pt stated that she is doing well no problems. 

## 2019-05-20 NOTE — Telephone Encounter (Signed)
Pt prescreened no symptoms has face mask.   Coronavirus (COVID-19) Are you at risk?  Are you at risk for the Coronavirus (COVID-19)?  To be considered HIGH RISK for Coronavirus (COVID-19), you have to meet the following criteria:  . Traveled to [place], [place], [place], [place] or [place]; or in the [place] to [place], [place], [place], or [place]; and have fever, cough, and shortness of breath within the last 2 weeks of travel OR . Been in close contact with a person diagnosed with COVID-19 within the last 2 weeks and have fever, cough, and shortness of breath . IF YOU DO NOT MEET THESE CRITERIA, YOU ARE CONSIDERED LOW RISK FOR COVID-19.  What to do if you are HIGH RISK for COVID-19?  . If you are having a medical emergency, call 911. . Seek medical care right away. Before you go to a doctor's office, urgent care or emergency department, call ahead and tell them about your recent travel, contact with someone diagnosed with COVID-19, and your symptoms. You should receive instructions from your physician's office regarding next steps of care.  . When you arrive at healthcare provider, tell the healthcare staff immediately you have returned from visiting [place], [place], [place], [place] or [place]; or traveled in the [place] to [place], [place], [place], or [place]; in the last two weeks or you have been in close contact with a person diagnosed with COVID-19 in the last 2 weeks.   . Tell the health care staff about your symptoms: fever, cough and shortness of breath. . After you have been seen by a medical provider, you will be either: o Tested for (COVID-19) and discharged home on quarantine except to seek medical care if symptoms worsen, and asked to  - Stay home and avoid contact with others until you get your results (4-5 days)  - Avoid travel on public transportation if possible (such as bus, train, or airplane) or o Sent to the Emergency Department by EMS for  evaluation, COVID-19 testing, and possible admission depending on your condition and test results.  What to do if you are LOW RISK for COVID-19?  Reduce your risk of any infection by using the same precautions used for avoiding the common cold or flu:  . Wash your hands often with soap and warm water for at least 20 seconds.  If soap and water are not readily available, use an alcohol-based hand sanitizer with at least 60% alcohol.  . If coughing or sneezing, cover your mouth and nose by coughing or sneezing into the elbow areas of your shirt or coat, into a tissue or into your sleeve (not your hands). . Avoid shaking hands with others and consider head nods or verbal greetings only. . Avoid touching your eyes, nose, or mouth with unwashed hands.  . Avoid close contact with people who are sick. . Avoid places or events with large numbers of people in one location, like concerts or sporting events. . Carefully consider travel plans you have or are making. . If you are planning any travel outside or inside the US, visit the CDC's Travelers' Health webpage for the latest health notices. . If you have some symptoms but not all symptoms, continue to monitor at home and seek medical attention if your symptoms worsen. . If you are having a medical emergency, call 911.   ADDITIONAL HEALTHCARE OPTIONS FOR PATIENTS  [facility] / e-Visit: [email or web address]           [facility]: [phone]  [facility]: [phone]                   [facility]: [phone]  

## 2019-05-21 ENCOUNTER — Ambulatory Visit (INDEPENDENT_AMBULATORY_CARE_PROVIDER_SITE_OTHER): Payer: Medicaid Other | Admitting: Obstetrics and Gynecology

## 2019-05-21 ENCOUNTER — Other Ambulatory Visit: Payer: Self-pay

## 2019-05-21 ENCOUNTER — Encounter: Payer: Self-pay | Admitting: Obstetrics and Gynecology

## 2019-05-21 VITALS — BP 94/55 | HR 77 | Wt 249.8 lb

## 2019-05-21 DIAGNOSIS — Z3483 Encounter for supervision of other normal pregnancy, third trimester: Secondary | ICD-10-CM

## 2019-05-21 DIAGNOSIS — Z8619 Personal history of other infectious and parasitic diseases: Secondary | ICD-10-CM

## 2019-05-21 DIAGNOSIS — O26849 Uterine size-date discrepancy, unspecified trimester: Secondary | ICD-10-CM

## 2019-05-21 DIAGNOSIS — Z113 Encounter for screening for infections with a predominantly sexual mode of transmission: Secondary | ICD-10-CM

## 2019-05-21 LAB — POCT URINALYSIS DIPSTICK OB
Bilirubin, UA: NEGATIVE
Blood, UA: NEGATIVE
Glucose, UA: NEGATIVE
Ketones, UA: NEGATIVE
Leukocytes, UA: NEGATIVE
Nitrite, UA: NEGATIVE
POC,PROTEIN,UA: NEGATIVE
Spec Grav, UA: 1.02 (ref 1.010–1.025)
Urobilinogen, UA: 1 E.U./dL
pH, UA: 7 (ref 5.0–8.0)

## 2019-05-21 NOTE — Progress Notes (Signed)
ROB: Patient doing well, no major complaints.  Had growth scan due to S>D last week, growth noting 47%ile (6 + lbs), however growth discrepancy still noted today on FH. Leopold's more like 8 lbs. Advised to begin HSV prophylaxis that was prescribed last visit.  GC/Cl ordered today as not performed last week. GBS neg. RTC in 1 week. Given labor precautions.

## 2019-05-21 NOTE — Addendum Note (Signed)
Addended by: Edwyna Shell on: 05/21/2019 04:00 PM   Modules accepted: Orders

## 2019-05-22 LAB — GC/CHLAMYDIA PROBE AMP
Chlamydia trachomatis, NAA: NEGATIVE
Neisseria Gonorrhoeae by PCR: NEGATIVE

## 2019-05-28 ENCOUNTER — Ambulatory Visit (INDEPENDENT_AMBULATORY_CARE_PROVIDER_SITE_OTHER): Payer: Medicaid Other | Admitting: Obstetrics and Gynecology

## 2019-05-28 ENCOUNTER — Other Ambulatory Visit: Payer: Self-pay

## 2019-05-28 ENCOUNTER — Encounter: Payer: Self-pay | Admitting: Obstetrics and Gynecology

## 2019-05-28 VITALS — BP 125/62 | HR 102 | Ht 69.0 in | Wt 249.3 lb

## 2019-05-28 DIAGNOSIS — Z3483 Encounter for supervision of other normal pregnancy, third trimester: Secondary | ICD-10-CM

## 2019-05-28 LAB — POCT URINALYSIS DIPSTICK OB
Bilirubin, UA: NEGATIVE
Blood, UA: NEGATIVE
Glucose, UA: NEGATIVE
Ketones, UA: NEGATIVE
Leukocytes, UA: NEGATIVE
Nitrite, UA: NEGATIVE
Spec Grav, UA: 1.01 (ref 1.010–1.025)
Urobilinogen, UA: 0.2 E.U./dL
pH, UA: 7 (ref 5.0–8.0)

## 2019-05-28 NOTE — Progress Notes (Signed)
ROB: No complaints.  Denies contractions.  Ultrasound shows 46 percentile although patient measures larger.  History of 2 previous macrosomic babies.  Patient has started HSV medicine as directed.

## 2019-05-28 NOTE — Progress Notes (Signed)
Patient comes in today for [place] visit. No concerns today. Patient does not want to be checked today.

## 2019-05-29 LAB — GC/CHLAMYDIA PROBE AMP
Chlamydia trachomatis, NAA: NEGATIVE
Neisseria Gonorrhoeae by PCR: NEGATIVE

## 2019-06-04 ENCOUNTER — Ambulatory Visit (INDEPENDENT_AMBULATORY_CARE_PROVIDER_SITE_OTHER): Payer: Medicaid Other | Admitting: Obstetrics and Gynecology

## 2019-06-04 ENCOUNTER — Encounter: Payer: Self-pay | Admitting: Obstetrics and Gynecology

## 2019-06-04 ENCOUNTER — Other Ambulatory Visit: Payer: Self-pay

## 2019-06-04 VITALS — BP 110/66 | HR 94 | Ht 69.0 in | Wt 251.2 lb

## 2019-06-04 DIAGNOSIS — Z3483 Encounter for supervision of other normal pregnancy, third trimester: Secondary | ICD-10-CM

## 2019-06-04 LAB — POCT URINALYSIS DIPSTICK OB
Bilirubin, UA: NEGATIVE
Blood, UA: NEGATIVE
Glucose, UA: NEGATIVE
Ketones, UA: NEGATIVE
Leukocytes, UA: NEGATIVE
Nitrite, UA: NEGATIVE
Spec Grav, UA: 1.01 (ref 1.010–1.025)
Urobilinogen, UA: 0.2 E.U./dL
pH, UA: 7 (ref 5.0–8.0)

## 2019-06-04 NOTE — Progress Notes (Signed)
ROB: Patient denies contractions.  Active daily fetal movement

## 2019-06-04 NOTE — Progress Notes (Signed)
Patient comes in today for ROB visit. She has no concerns.  

## 2019-06-10 ENCOUNTER — Telehealth: Payer: Self-pay | Admitting: *Deleted

## 2019-06-10 NOTE — Telephone Encounter (Signed)
Coronavirus (COVID-19) Are you at risk?  Are you at risk for the Coronavirus (COVID-19)?  To be considered HIGH RISK for Coronavirus (COVID-19), you have to meet the following criteria:  . Traveled to [place], [place], [place], [place] or [place]; or in the [place] to [place], [place], [place], or [place]; and have fever, cough, and shortness of breath within the last 2 weeks of travel OR . Been in close contact with a person diagnosed with COVID-19 within the last 2 weeks and have fever, cough, and shortness of breath . IF YOU DO NOT MEET THESE CRITERIA, YOU ARE CONSIDERED LOW RISK FOR COVID-19.  What to do if you are HIGH RISK for COVID-19?  . If you are having a medical emergency, call 911. . Seek medical care right away. Before you go to a doctor's office, urgent care or emergency department, call ahead and tell them about your recent travel, contact with someone diagnosed with COVID-19, and your symptoms. You should receive instructions from your physician's office regarding next steps of care.  . When you arrive at healthcare provider, tell the healthcare staff immediately you have returned from visiting [place], [place], [place], [place] or [place]; or traveled in the [place] to [place], [place], [place], or [place]; in the last two weeks or you have been in close contact with a person diagnosed with COVID-19 in the last 2 weeks.   . Tell the health care staff about your symptoms: fever, cough and shortness of breath. . After you have been seen by a medical provider, you will be either: o Tested for (COVID-19) and discharged home on quarantine except to seek medical care if symptoms worsen, and asked to  - Stay home and avoid contact with others until you get your results (4-5 days)  - Avoid travel on public transportation if possible (such as bus, train, or airplane) or o Sent to the Emergency Department by EMS for evaluation, COVID-19 testing, and possible  admission depending on your condition and test results.  What to do if you are LOW RISK for COVID-19?  Reduce your risk of any infection by using the same precautions used for avoiding the common cold or flu:  . Wash your hands often with soap and warm water for at least 20 seconds.  If soap and water are not readily available, use an alcohol-based hand sanitizer with at least 60% alcohol.  . If coughing or sneezing, cover your mouth and nose by coughing or sneezing into the elbow areas of your shirt or coat, into a tissue or into your sleeve (not your hands). . Avoid shaking hands with others and consider head nods or verbal greetings only. . Avoid touching your eyes, nose, or mouth with unwashed hands.  . Avoid close contact with people who are sick. . Avoid places or events with large numbers of people in one location, like concerts or sporting events. . Carefully consider travel plans you have or are making. . If you are planning any travel outside or inside the US, visit the CDC's Travelers' Health webpage for the latest health notices. . If you have some symptoms but not all symptoms, continue to monitor at home and seek medical attention if your symptoms worsen. . If you are having a medical emergency, call 911.   ADDITIONAL HEALTHCARE OPTIONS FOR PATIENTS  [facility] / e-Visit: [email or web address]         [facility]: [phone]  [facility]: [phone]                   [facility]: [phone]   Spoke with pt denies any sx.  Andric Kerce, CMA 

## 2019-06-11 ENCOUNTER — Other Ambulatory Visit: Payer: Self-pay

## 2019-06-11 ENCOUNTER — Encounter: Payer: Self-pay | Admitting: Obstetrics and Gynecology

## 2019-06-11 ENCOUNTER — Ambulatory Visit (INDEPENDENT_AMBULATORY_CARE_PROVIDER_SITE_OTHER): Payer: Medicaid Other | Admitting: Obstetrics and Gynecology

## 2019-06-11 VITALS — BP 100/58 | HR 80 | Wt 249.6 lb

## 2019-06-11 DIAGNOSIS — O480 Post-term pregnancy: Secondary | ICD-10-CM

## 2019-06-11 DIAGNOSIS — Z3483 Encounter for supervision of other normal pregnancy, third trimester: Secondary | ICD-10-CM

## 2019-06-11 LAB — POCT URINALYSIS DIPSTICK OB
Bilirubin, UA: NEGATIVE
Blood, UA: NEGATIVE
Glucose, UA: NEGATIVE
Ketones, UA: NEGATIVE
Leukocytes, UA: NEGATIVE
Nitrite, UA: NEGATIVE
POC,PROTEIN,UA: NEGATIVE
Spec Grav, UA: 1.025 (ref 1.010–1.025)
Urobilinogen, UA: 0.2 E.U./dL
pH, UA: 6.5 (ref 5.0–8.0)

## 2019-06-11 NOTE — Progress Notes (Signed)
ROB: Patient doing well, no complaints. Discussed labor precautions. Desires delayed cord clamping until pulsation ends. RTC in 1 week. To discuss IOL if no labor by next week. For growth scan and BPP next visit.

## 2019-06-11 NOTE — Progress Notes (Signed)
ROB-Pt present for prenatal care. Pt stated that she was doing well no problems.  

## 2019-06-18 ENCOUNTER — Ambulatory Visit (INDEPENDENT_AMBULATORY_CARE_PROVIDER_SITE_OTHER): Payer: Medicaid Other | Admitting: Obstetrics and Gynecology

## 2019-06-18 ENCOUNTER — Other Ambulatory Visit: Payer: Self-pay

## 2019-06-18 ENCOUNTER — Telehealth: Payer: Self-pay

## 2019-06-18 ENCOUNTER — Encounter: Payer: Self-pay | Admitting: Obstetrics and Gynecology

## 2019-06-18 VITALS — BP 110/72 | HR 82 | Ht 69.0 in

## 2019-06-18 DIAGNOSIS — Z3483 Encounter for supervision of other normal pregnancy, third trimester: Secondary | ICD-10-CM

## 2019-06-18 DIAGNOSIS — O480 Post-term pregnancy: Secondary | ICD-10-CM

## 2019-06-18 LAB — POCT URINALYSIS DIPSTICK OB
Bilirubin, UA: NEGATIVE
Blood, UA: NEGATIVE
Glucose, UA: NEGATIVE
Ketones, UA: NEGATIVE
Leukocytes, UA: NEGATIVE
Nitrite, UA: NEGATIVE
Spec Grav, UA: 1.01 (ref 1.010–1.025)
Urobilinogen, UA: 0.2 E.U./dL
pH, UA: 7.5 (ref 5.0–8.0)

## 2019-06-18 NOTE — Telephone Encounter (Signed)
Coronavirus (COVID-19) Are you at risk?  Are you at risk for the Coronavirus (COVID-19)?  To be considered HIGH RISK for Coronavirus (COVID-19), you have to meet the following criteria:  . Traveled to [place], [place], [place], [place] or [place]; or in the [place] to [place], [place], [place], or [place]; and have fever, cough, and shortness of breath within the last 2 weeks of travel OR . Been in close contact with a person diagnosed with COVID-19 within the last 2 weeks and have fever, cough, and shortness of breath . IF YOU DO NOT MEET THESE CRITERIA, YOU ARE CONSIDERED LOW RISK FOR COVID-19.  What to do if you are HIGH RISK for COVID-19?  . If you are having a medical emergency, call 911. . Seek medical care right away. Before you go to a doctor's office, urgent care or emergency department, call ahead and tell them about your recent travel, contact with someone diagnosed with COVID-19, and your symptoms. You should receive instructions from your physician's office regarding next steps of care.  . When you arrive at healthcare provider, tell the healthcare staff immediately you have returned from visiting [place], [place], [place], [place] or [place]; or traveled in the [place] to [place], [place], [place], or [place]; in the last two weeks or you have been in close contact with a person diagnosed with COVID-19 in the last 2 weeks.   . Tell the health care staff about your symptoms: fever, cough and shortness of breath. . After you have been seen by a medical provider, you will be either: o Tested for (COVID-19) and discharged home on quarantine except to seek medical care if symptoms worsen, and asked to  - Stay home and avoid contact with others until you get your results (4-5 days)  - Avoid travel on public transportation if possible (such as bus, train, or airplane) or o Sent to the Emergency Department by EMS for evaluation, COVID-19 testing, and possible  admission depending on your condition and test results.  What to do if you are LOW RISK for COVID-19?  Reduce your risk of any infection by using the same precautions used for avoiding the common cold or flu:  . Wash your hands often with soap and warm water for at least 20 seconds.  If soap and water are not readily available, use an alcohol-based hand sanitizer with at least 60% alcohol.  . If coughing or sneezing, cover your mouth and nose by coughing or sneezing into the elbow areas of your shirt or coat, into a tissue or into your sleeve (not your hands). . Avoid shaking hands with others and consider head nods or verbal greetings only. . Avoid touching your eyes, nose, or mouth with unwashed hands.  . Avoid close contact with people who are sick. . Avoid places or events with large numbers of people in one location, like concerts or sporting events. . Carefully consider travel plans you have or are making. . If you are planning any travel outside or inside the US, visit the CDC's Travelers' Health webpage for the latest health notices. . If you have some symptoms but not all symptoms, continue to monitor at home and seek medical attention if your symptoms worsen. . If you are having a medical emergency, call 911.   ADDITIONAL HEALTHCARE OPTIONS FOR PATIENTS  [facility] / e-Visit: [email or web address]         [facility]: [phone]  [facility]: [phone]                   [facility]: [phone]   Prescreened. Neg .cm 

## 2019-06-18 NOTE — Progress Notes (Signed)
Patient comes in today for ROB. No concerns.  

## 2019-06-18 NOTE — Addendum Note (Signed)
Addended by: Finis Bud on: 06/18/2019 03:49 PM   Modules accepted: Orders, SmartSet

## 2019-06-18 NOTE — Progress Notes (Signed)
ROB: Denies contractions.  Denies problems.  Biophysical profile tomorrow for postdates.  Induction of labor discussed and scheduled for 7/14

## 2019-06-19 ENCOUNTER — Ambulatory Visit (INDEPENDENT_AMBULATORY_CARE_PROVIDER_SITE_OTHER): Payer: Medicaid Other

## 2019-06-19 DIAGNOSIS — O480 Post-term pregnancy: Secondary | ICD-10-CM

## 2019-06-19 DIAGNOSIS — Z3A40 40 weeks gestation of pregnancy: Secondary | ICD-10-CM | POA: Diagnosis not present

## 2019-06-20 ENCOUNTER — Other Ambulatory Visit
Admission: RE | Admit: 2019-06-20 | Discharge: 2019-06-20 | Disposition: A | Discharge status: Home / Self Care | Payer: Medicaid Other | Source: Ambulatory Visit | Attending: Obstetrics and Gynecology | Admitting: Obstetrics and Gynecology

## 2019-06-20 ENCOUNTER — Other Ambulatory Visit: Payer: Self-pay

## 2019-06-20 DIAGNOSIS — Z1159 Encounter for screening for other viral diseases: Secondary | ICD-10-CM | POA: Diagnosis not present

## 2019-06-20 DIAGNOSIS — Z01812 Encounter for preprocedural laboratory examination: Secondary | ICD-10-CM | POA: Insufficient documentation

## 2019-06-21 LAB — SARS CORONAVIRUS 2 (TAT 6-24 HRS): SARS Coronavirus 2: NEGATIVE

## 2019-06-22 ENCOUNTER — Other Ambulatory Visit: Payer: Self-pay

## 2019-06-22 ENCOUNTER — Inpatient Hospital Stay: Payer: Medicaid Other | Admitting: Anesthesiology

## 2019-06-22 ENCOUNTER — Inpatient Hospital Stay
Admission: EM | Admit: 2019-06-22 | Discharge: 2019-06-24 | DRG: 806 | Disposition: A | Discharge status: Home / Self Care | Payer: Medicaid Other | Attending: Obstetrics and Gynecology | Admitting: Obstetrics and Gynecology

## 2019-06-22 ENCOUNTER — Encounter: Payer: Self-pay | Admitting: Certified Nurse Midwife

## 2019-06-22 DIAGNOSIS — O9081 Anemia of the puerperium: Secondary | ICD-10-CM | POA: Diagnosis not present

## 2019-06-22 DIAGNOSIS — O480 Post-term pregnancy: Secondary | ICD-10-CM | POA: Diagnosis present

## 2019-06-22 DIAGNOSIS — Z3A40 40 weeks gestation of pregnancy: Secondary | ICD-10-CM | POA: Diagnosis not present

## 2019-06-22 DIAGNOSIS — O4202 Full-term premature rupture of membranes, onset of labor within 24 hours of rupture: Secondary | ICD-10-CM

## 2019-06-22 DIAGNOSIS — O99214 Obesity complicating childbirth: Secondary | ICD-10-CM | POA: Diagnosis present

## 2019-06-22 DIAGNOSIS — E668 Other obesity: Secondary | ICD-10-CM

## 2019-06-22 DIAGNOSIS — E669 Obesity, unspecified: Secondary | ICD-10-CM | POA: Diagnosis present

## 2019-06-22 DIAGNOSIS — A6000 Herpesviral infection of urogenital system, unspecified: Secondary | ICD-10-CM | POA: Diagnosis present

## 2019-06-22 DIAGNOSIS — Z8619 Personal history of other infectious and parasitic diseases: Secondary | ICD-10-CM | POA: Diagnosis present

## 2019-06-22 DIAGNOSIS — O9832 Other infections with a predominantly sexual mode of transmission complicating childbirth: Secondary | ICD-10-CM | POA: Diagnosis present

## 2019-06-22 LAB — CBC
HCT: 36.9 % (ref 36.0–46.0)
Hemoglobin: 11.9 g/dL — ABNORMAL LOW (ref 12.0–15.0)
MCH: 26 pg (ref 26.0–34.0)
MCHC: 32.2 g/dL (ref 30.0–36.0)
MCV: 80.7 fL (ref 80.0–100.0)
Platelets: 259 10*3/uL (ref 150–400)
RBC: 4.57 MIL/uL (ref 3.87–5.11)
RDW: 14.2 % (ref 11.5–15.5)
WBC: 14.4 10*3/uL — ABNORMAL HIGH (ref 4.0–10.5)
nRBC: 0 % (ref 0.0–0.2)

## 2019-06-22 MED ORDER — LACTATED RINGERS IV SOLN: INTRAVENOUS | Status: DC

## 2019-06-22 MED ORDER — LACTATED RINGERS IV SOLN: 500.0000 mL | INTRAVENOUS | Status: DC | PRN

## 2019-06-22 MED ORDER — AMMONIA AROMATIC IN INHA
RESPIRATORY_TRACT | Status: AC
  Filled 2019-06-22: qty 10

## 2019-06-22 MED ORDER — LIDOCAINE HCL (PF) 1 % IJ SOLN: 30.0000 mL | INTRAMUSCULAR | Status: DC | PRN

## 2019-06-22 MED ORDER — LIDOCAINE HCL (PF) 1 % IJ SOLN
INTRAMUSCULAR | Status: AC
  Filled 2019-06-22: qty 30

## 2019-06-22 MED ORDER — OXYTOCIN 40 UNITS IN NORMAL SALINE INFUSION - SIMPLE MED
2.5000 [IU]/h | INTRAVENOUS | Status: DC
  Filled 2019-06-22: qty 1000

## 2019-06-22 MED ORDER — ONDANSETRON HCL 4 MG/2ML IJ SOLN: 4.0000 mg | Freq: Four times a day (QID) | INTRAMUSCULAR | Status: DC | PRN

## 2019-06-22 MED ORDER — MISOPROSTOL 200 MCG PO TABS
ORAL_TABLET | ORAL | Status: AC
  Filled 2019-06-22: qty 4

## 2019-06-22 MED ORDER — SOD CITRATE-CITRIC ACID 500-334 MG/5ML PO SOLN: 30.0000 mL | ORAL | Status: DC | PRN

## 2019-06-22 MED ORDER — BUTORPHANOL TARTRATE 2 MG/ML IJ SOLN: 1.0000 mg | INTRAMUSCULAR | Status: DC | PRN

## 2019-06-22 MED ORDER — MISOPROSTOL 50MCG HALF TABLET: 50.0000 ug | ORAL_TABLET | ORAL | Status: DC | PRN

## 2019-06-22 MED ORDER — ACETAMINOPHEN 325 MG PO TABS: 650.0000 mg | ORAL_TABLET | ORAL | Status: DC | PRN

## 2019-06-22 MED ORDER — OXYTOCIN 10 UNIT/ML IJ SOLN
INTRAMUSCULAR | Status: AC
  Filled 2019-06-22: qty 2

## 2019-06-22 MED ORDER — OXYTOCIN BOLUS FROM INFUSION
500.0000 mL | Freq: Once | INTRAVENOUS | Status: AC
  Administered 2019-06-23: 500 mL via INTRAVENOUS

## 2019-06-22 MED ORDER — FENTANYL 2.5 MCG/ML W/ROPIVACAINE 0.15% IN NS 100 ML EPIDURAL (ARMC)
EPIDURAL | Status: AC
  Filled 2019-06-22: qty 100

## 2019-06-22 NOTE — H&P (Signed)
Obstetric History and Physical  Sharon Salinas is a 33 y.o. [phone] with IUP at 103w6d presenting for complaints of contractions since 5 pm this evening. Patient states she has been having  regular, every 5 minutes contractions, no vaginal bleeding.  Patient had spontaneous SROM with thick meconium ~ 1 hour into her labor evaluation.  She reports active fetal movement.    Prenatal Course Source of Care: [facility] with onset of care at 10 weeks Pregnancy complications or risks: Patient Active Problem List   Diagnosis Date Noted  . Post-dates pregnancy 06/22/2019  . History of herpes genitalis 05/21/2019  . Uterine size date discrepancy pregnancy 05/21/2019  . Abnormal glucose tolerance in pregnancy 01/14/2019  . Fetal cardiac echogenic focus 01/14/2019  . History of gestational diabetes in prior pregnancy, currently pregnant 11/19/2018  . Mild obesity 11/19/2018  . History of loop electrical excision procedure (LEEP) 11/19/2018  . Pregnancy 10/31/2018   She plans to breastfeed She desires unsure method for postpartum contraception.   Prenatal labs and studies: ABO, Rh: A POS (07/12 2246) Antibody: NEGATIVE (07/12 2246) Rubella: 6.29 (12/10 1529) RPR: Non Reactive (04/24 0813)  HBsAg: Negative (12/10 1529)  HIV: Non Reactive (12/10 1529)  GGY:IRSWNIOE (06/04 1516) 1 hr Glucola: early 1 hr glucola abnormal (157) with normal 3 hr testing, repeat 3 hr testing at 28 weeks also normal.  Genetic screening normal Anatomy US normal    OB History  Gravida Para Term Preterm AB Living  6 3 3   2 3   SAB TAB Ectopic Multiple Live Births    2     3    # Outcome Date GA Lbr Len/2nd Weight Sex Delivery Anes PTL Lv  6 Current           5 TAB 2017          4 Term 2014 103w0d  3997 g F Vag-Spont   LIV  3 Term 2012 [redacted]w[redacted]d  4252 g F Vag-Spont   LIV  2 TAB 2011          1 Term 2006 [redacted]w[redacted]d  3515 g F Vag-Spont   LIV    Obstetric Comments  G2 and G3 were inductions of labor.      Past Medical History:  Diagnosis Date  . History of abnormal cervical Pap smear 2016   s/p LEEP  . Obesity (BMI 30-39.9)     Past Surgical History:  Procedure Laterality Date  . LEEP  2016  . WISDOM TOOTH EXTRACTION      Social History   Socioeconomic History  . Marital status: Single    Spouse name: Not on file  . Number of children: 3  . Years of education: Not on file  . Highest education level: Not on file  Occupational History  . Not on file  Social Needs  . Financial resource strain: Not hard at all  . Food insecurity    Worry: Never true    Inability: Never true  . Transportation needs    Medical: No    Non-medical: No  Tobacco Use  . Smoking status: Never Smoker  . Smokeless tobacco: Never Used  Substance and Sexual Activity  . Alcohol use: Not Currently    Frequency: Never  . Drug use: Never  . Sexual activity: Yes    Partners: Male  Lifestyle  . Physical activity    Days per week: 0 days    Minutes per session: 0 min  . Stress: Not  at all  Relationships  . Social connections    Talks on phone: More than three times a week    Gets together: More than three times a week    Attends religious service: Never    Active member of club or organization: No    Attends meetings of clubs or organizations: Not on file    Relationship status: Living with partner  Other Topics Concern  . Not on file  Social History Narrative  . Not on file    Family History  Problem Relation Age of Onset  . Cancer Mother   . Seizures Mother   . Hyperlipidemia Father     Medications Prior to Admission  Medication Sig Dispense Refill Last Dose  . Prenatal Vit-Fe Fumarate-FA (PRENATAL MULTIVITAMIN) TABS tablet Take 1 tablet by mouth daily at 12 noon.   06/22/2019 at Unknown time  . acetaminophen (TYLENOL) 500 MG tablet Take 500 mg by mouth every 6 (six) hours as needed.   Not Taking at Unknown time    No Known Allergies  Review of Systems: Negative except for  what is mentioned in HPI.  Physical Exam: BP 131/82 (BP Location: Left Arm)   Pulse 96   Temp 98.3 F (36.8 C) (Oral)   Resp 20   Ht 5\' 9"  (1.753 m)   Wt 115.2 kg   LMP 08/31/2018 (Exact Date)   BMI 37.51 kg/m  CONSTITUTIONAL: Well-developed, well-nourished female in no acute distress.  HENT:  Normocephalic, atraumatic, External right and left ear normal. Oropharynx is clear and moist EYES: Conjunctivae and EOM are normal. Pupils are equal, round, and reactive to light. No scleral icterus.  NECK: Normal range of motion, supple, no masses SKIN: Skin is warm and dry. No rash noted. Not diaphoretic. No erythema. No pallor. NEUROLOGIC: Alert and oriented to person, place, and time. Normal reflexes, muscle tone coordination. No cranial nerve deficit noted. PSYCHIATRIC: Normal mood and affect. Normal behavior. Normal judgment and thought content. CARDIOVASCULAR: Normal heart rate noted, regular rhythm RESPIRATORY: Effort and breath sounds normal, no problems with respiration noted ABDOMEN: Soft, nontender, nondistended, gravid. MUSCULOSKELETAL: Normal range of motion. No edema and no tenderness. 2+ distal pulses.  Cervical Exam: Dilatation 7cm   Effacement 100%   Station 0. Thick meconium stained fluid.  Presentation: cephalic FHT:  Baseline rate 115 bpm   Variability moderate  Accelerations present   Decelerations: 1 late deceleration at 22:20 from baseline to 100s, and several shallow variable decelerations.  Contractions: Every 2-3 mins   Pertinent Labs/Studies:   Results for orders placed or performed during the hospital encounter of 06/22/19 (from the past 24 hour(s))  Type and screen     Status: None (Preliminary result)   Collection Time: 06/22/19 10:46 PM  Result Value Ref Range   ABO/RH(D) PENDING    Antibody Screen PENDING    Sample Expiration      06/25/2019,2359 Performed at [facility], [address] [place], [place] [address]   CBC     Status: Abnormal    Collection Time: 06/22/19 10:47 PM  Result Value Ref Range   WBC 14.4 (H) 4.0 - 10.5 K/uL   RBC 4.57 3.87 - 5.11 MIL/uL   Hemoglobin 11.9 (L) 12.0 - 15.0 g/dL   HCT 81.136.9 91.436.0 - 78.246.0 %   MCV 80.7 80.0 - 100.0 fL   MCH 26.0 26.0 - 34.0 pg   MCHC 32.2 30.0 - 36.0 g/dL   RDW 95.614.2 21.311.5 - 08.615.5 %  Platelets 259 150 - 400 K/uL   nRBC 0.0 0.0 - 0.2 %    Assessment : Sharon Salinas is a 33 y.o. [phone] at 148w6d being admitted for labor.  H/o obesity in pregnancy. H/o HSV II outbreak during pregnancy, on HSV suppression.    Plan: Labor: Expectant management. Augmentation if needed as per protocol with Pitocin. Analgesia as needed.  Patient currently sitting up for epidural.  FWB: Fetal status overall reassuring.  GBS negative. Will notify Peds of thick meconium.  Delivery plan: Hopeful for vaginal delivery    Hildred Laserherry, Mitchell Epling, MD [facility]

## 2019-06-22 NOTE — OB Triage Note (Signed)
Pt states contraction started at 5PM and are 5 minutes apart. Denies vaginal bleeding or LOF. Endorses + fetal movement. Is on for induction on Tuesday.

## 2019-06-23 ENCOUNTER — Encounter: Payer: Self-pay | Admitting: Certified Nurse Midwife

## 2019-06-23 LAB — CBC
HCT: 34 % — ABNORMAL LOW (ref 36.0–46.0)
Hemoglobin: 10.9 g/dL — ABNORMAL LOW (ref 12.0–15.0)
MCH: 26.2 pg (ref 26.0–34.0)
MCHC: 32.1 g/dL (ref 30.0–36.0)
MCV: 81.7 fL (ref 80.0–100.0)
Platelets: 244 10*3/uL (ref 150–400)
RBC: 4.16 MIL/uL (ref 3.87–5.11)
RDW: 14.1 % (ref 11.5–15.5)
WBC: 17.7 10*3/uL — ABNORMAL HIGH (ref 4.0–10.5)
nRBC: 0 % (ref 0.0–0.2)

## 2019-06-23 LAB — TYPE AND SCREEN
ABO/RH(D): A POS
Antibody Screen: NEGATIVE

## 2019-06-23 MED ORDER — PHENYLEPHRINE 40 MCG/ML (10ML) SYRINGE FOR IV PUSH (FOR BLOOD PRESSURE SUPPORT)
80.0000 ug | PREFILLED_SYRINGE | INTRAVENOUS | Status: DC | PRN
  Filled 2019-06-23: qty 10

## 2019-06-23 MED ORDER — DIPHENHYDRAMINE HCL 25 MG PO CAPS: 25.0000 mg | ORAL_CAPSULE | Freq: Four times a day (QID) | ORAL | Status: DC | PRN

## 2019-06-23 MED ORDER — ACETAMINOPHEN 325 MG PO TABS: 650.0000 mg | ORAL_TABLET | ORAL | Status: DC | PRN

## 2019-06-23 MED ORDER — EPHEDRINE 5 MG/ML INJ
10.0000 mg | INTRAVENOUS | Status: DC | PRN
  Filled 2019-06-23: qty 2

## 2019-06-23 MED ORDER — SODIUM CHLORIDE 0.9 % IV SOLN
INTRAVENOUS | Status: AC | PRN
  Administered 2019-06-22 (×3): 5 mL via EPIDURAL

## 2019-06-23 MED ORDER — IBUPROFEN 800 MG PO TABS
800.0000 mg | ORAL_TABLET | Freq: Four times a day (QID) | ORAL | Status: DC
  Administered 2019-06-23: 800 mg via ORAL
  Filled 2019-06-23 (×3): qty 1

## 2019-06-23 MED ORDER — ONDANSETRON HCL 4 MG PO TABS: 4.0000 mg | ORAL_TABLET | ORAL | Status: DC | PRN

## 2019-06-23 MED ORDER — SIMETHICONE 80 MG PO CHEW: 80.0000 mg | CHEWABLE_TABLET | ORAL | Status: DC | PRN

## 2019-06-23 MED ORDER — WITCH HAZEL-GLYCERIN EX PADS
1.0000 "application " | MEDICATED_PAD | CUTANEOUS | Status: DC | PRN
  Filled 2019-06-23: qty 100

## 2019-06-23 MED ORDER — LIDOCAINE HCL (PF) 1 % IJ SOLN
INTRAMUSCULAR | Status: AC | PRN
  Administered 2019-06-22: 1 mL

## 2019-06-23 MED ORDER — DIPHENHYDRAMINE HCL 50 MG/ML IJ SOLN: 12.5000 mg | INTRAMUSCULAR | Status: DC | PRN

## 2019-06-23 MED ORDER — FENTANYL 2.5 MCG/ML W/ROPIVACAINE 0.15% IN NS 100 ML EPIDURAL (ARMC): 12.0000 mL/h | EPIDURAL | Status: DC

## 2019-06-23 MED ORDER — SENNOSIDES-DOCUSATE SODIUM 8.6-50 MG PO TABS
2.0000 | ORAL_TABLET | ORAL | Status: DC
  Filled 2019-06-23: qty 2

## 2019-06-23 MED ORDER — DIBUCAINE (PERIANAL) 1 % EX OINT
1.0000 "application " | TOPICAL_OINTMENT | CUTANEOUS | Status: DC | PRN
  Filled 2019-06-23: qty 28

## 2019-06-23 MED ORDER — BENZOCAINE-MENTHOL 20-0.5 % EX AERO: 1.0000 "application " | INHALATION_SPRAY | CUTANEOUS | Status: DC | PRN

## 2019-06-23 MED ORDER — LACTATED RINGERS IV SOLN: 500.0000 mL | Freq: Once | INTRAVENOUS | Status: DC

## 2019-06-23 MED ORDER — ZOLPIDEM TARTRATE 5 MG PO TABS: 5.0000 mg | ORAL_TABLET | Freq: Every evening | ORAL | Status: DC | PRN

## 2019-06-23 MED ORDER — PRENATAL MULTIVITAMIN CH
1.0000 | ORAL_TABLET | Freq: Every day | ORAL | Status: DC
  Administered 2019-06-23: 1 via ORAL
  Filled 2019-06-23: qty 1

## 2019-06-23 MED ORDER — ONDANSETRON HCL 4 MG/2ML IJ SOLN: 4.0000 mg | INTRAMUSCULAR | Status: DC | PRN

## 2019-06-23 MED ORDER — COCONUT OIL OIL: 1.0000 "application " | TOPICAL_OIL | Status: DC | PRN

## 2019-06-23 NOTE — Progress Notes (Signed)
Post Partum Day # 1, s/p SVD  Subjective: no complaints, up ad lib, voiding and tolerating PO  Objective: Temp:  [98.2 F (36.8 C)-98.4 F (36.9 C)] 98.2 F (36.8 C) (07/13 0748) Pulse Rate:  [66-96] 75 (07/13 0748) Resp:  [18-20] 20 (07/13 0748) BP: (91-134)/(56-82) 115/76 (07/13 0748) SpO2:  [98 %-99 %] 98 % (07/13 0748) Weight:  [384.6 kg] 115.2 kg (07/12 2145)  Physical Exam:  General: alert and no distress  Lungs: clear to auscultation bilaterally Breasts: normal appearance, no masses or tenderness Heart: regular rate and rhythm, S1, S2 normal, no murmur, click, rub or gallop Abdomen: soft, non-tender; bowel sounds normal; no masses,  no organomegaly Pelvis: Lochia: appropriate, Uterine Fundus: firm Extremities: DVT Evaluation: No evidence of DVT seen on physical exam. Negative Homan's sign. No cords or calf tenderness. No significant calf/ankle edema.  Recent Labs    06/22/19 2247 06/23/19 0627  HGB 11.9* 10.9*  HCT 36.9 34.0*    Assessment/Plan: Doing well postpartum Mild anemia postpartum, asymptomatic.  Breastfeeding well Contraception undecided. To discuss further at postpartum visit.  Plan for discharge tomorrow.   LOS: 1 day   Rubie Maid, MD [facility] 06/23/2019 8:56 AM

## 2019-06-23 NOTE — Anesthesia Procedure Notes (Signed)
Epidural Patient location during procedure: OB Start time: 06/23/2019 11:43 PM End time: 06/23/2019 11:51 PM  Staffing Anesthesiologist: Jonna Clark, CRNA Performed: anesthesiologist   Preanesthetic Checklist Completed: patient identified, site marked, surgical consent, pre-op evaluation, timeout performed, IV checked, risks and benefits discussed and monitors and equipment checked  Epidural Patient position: sitting Prep: ChloraPrep Patient monitoring: heart rate, continuous pulse ox and blood pressure Approach: midline Location: L4-L5 Injection technique: LOR saline  Needle:  Needle type: Tuohy  Needle gauge: 18 G Needle length: 9 cm and 9 Needle insertion depth: 7 cm Catheter type: closed end flexible Catheter size: 20 Guage Catheter at skin depth: 12 cm Test dose: negative and Other  Assessment Events: blood not aspirated, injection not painful, no injection resistance, negative IV test and no paresthesia  Additional Notes   Patient tolerated the insertion well without complications.Reason for block:procedure for pain

## 2019-06-23 NOTE — Plan of Care (Signed)
Pt. Transferred to room 341 from L&D. Pt. Is alert and oriented with aprop. Affect. Color good, skin w&d. Oriented to room and instructed in Fall Prevention, Warden/ranger. Pt. V/O.

## 2019-06-23 NOTE — Lactation Note (Signed)
This note was copied from a baby's chart. Lactation Consultation Note  Patient Name: Girl Aniyla [place] Today's Date: 06/23/2019   Observed a couple of breast feeds today.  Aurora is latching with minimal assistance and has strong, rhythmic sucks with audible swallows.  Mom can hand express.  Mom breast fed other 3 babies for 3 months.  She had perceived low milk supply with others, but she also introduced bottles of formula early into breast feed.  Explained how that could have affected her milk supply. Reviewed newborn stomach size, feeding cues, supply and demand, normal course of lactation and routine newborn feeding patterns.  Lactation name and number written on white board and encouraged to call with any questions, concerns or assistance.  Maternal Data    Feeding    LATCH Score                   Interventions    Lactation Tools Discussed/Used     Consult Status      Jarold Motto 06/23/2019, 7:56 PM

## 2019-06-23 NOTE — Anesthesia Preprocedure Evaluation (Signed)
Anesthesia Evaluation  Patient identified by MRN, date of birth, ID band Patient awake    Reviewed: Allergy & Precautions, H&P , NPO status , Patient's Chart, lab work & pertinent test results  Airway Mallampati: II  TM Distance: >3 FB Neck ROM: full    Dental  (+) Teeth Intact   Pulmonary neg pulmonary ROS,           Cardiovascular Exercise Tolerance: Good (-) hypertensionnegative cardio ROS       Neuro/Psych    GI/Hepatic negative GI ROS,   Endo/Other  Morbid obesity  Renal/GU   negative genitourinary   Musculoskeletal   Abdominal   Peds  Hematology negative hematology ROS (+)   Anesthesia Other Findings Past Medical History: 2016: History of abnormal cervical Pap smear     Comment:  s/p LEEP No date: Obesity (BMI 30-39.9)  Past Surgical History: 2016: LEEP No date: WISDOM TOOTH EXTRACTION  BMI    Body Mass Index: 37.51 kg/m      Reproductive/Obstetrics (+) Pregnancy                             Anesthesia Physical Anesthesia Plan  ASA: III  Anesthesia Plan: Epidural   Post-op Pain Management:    Induction:   PONV Risk Score and Plan:   Airway Management Planned:   Additional Equipment:   Intra-op Plan:   Post-operative Plan:   Informed Consent: I have reviewed the patients History and Physical, chart, labs and discussed the procedure including the risks, benefits and alternatives for the proposed anesthesia with the patient or authorized representative who has indicated his/her understanding and acceptance.       Plan Discussed with: Anesthesiologist  Anesthesia Plan Comments:         Anesthesia Quick Evaluation

## 2019-06-23 NOTE — Discharge Instructions (Signed)

## 2019-06-24 LAB — RPR: RPR Ser Ql: NONREACTIVE

## 2019-06-24 MED ORDER — IBUPROFEN 800 MG PO TABS: 800.0000 mg | ORAL_TABLET | Freq: Three times a day (TID) | ORAL | 1 refills | Status: AC | PRN

## 2019-06-24 NOTE — Lactation Note (Addendum)
This note was copied from a baby's chart. Lactation Consultation Note  Patient Name: Girl Ichelle [place] Today's Date: 06/24/2019    [facility] spoke with mom as she was preparing to leave. Provided mom with information regarding virtual breastfeeding support group, and out-patient lactation services. Reviewed expectations for feeding, growth spurts/cluster feeding, skin to skin, and infants stomach size. Encouraged to feed at early hunger cues. Let mom know that she can call lactation with any questions or concerns.  LC spoke with mom this morning. Mom gave 2 bottles of formula early this morning, stating that infant cried and tummy rumbled- she feared something may have been in her breastmilk that was upsetting the baby.  Mom resumed breastfeeding at 845am, and plans to continue with ongoing breastfeeding. Mom has no concerns with infants latch at the breast, minimal pain or discomfort, some coconut oil is being used for tenderness.  Reviewed early hunger cues, aiming to feed before infant becomes upset, infants stomach size, feeding frequency, and impact formula may have on milk supply establishment. [facility] name and number on white board, encouraged mom to call out with questions/concerns.   Maternal Data    Feeding Feeding Type: Breast Fed Nipple Type: Slow - flow  LATCH Score                   Interventions    Lactation Tools Discussed/Used     Consult Status      Lavonia Drafts 06/24/2019, 9:29 AM

## 2019-06-24 NOTE — Discharge Summary (Signed)
OB Discharge Summary     Patient Name: Sharon Salinas DOB: 12-20-1985 MRN: [id number]  Date of admission: 06/22/2019 Delivering MD: Rubie Maid   Date of discharge: 06/24/2019  Admitting diagnosis: 30 Weeks Intrauterine pregnancy: [redacted]w[redacted]d     Secondary diagnosis:  Active Problems:   Moderate obesity   History of herpes genitalis   Post-dates pregnancy  Additional problems: None     Discharge diagnosis: Term Pregnancy Delivered and Anemia                                                                                                Post partum procedures:None  Augmentation: None  Complications: None  Hospital course:  Onset of Labor With Vaginal Delivery     33 y.o. yo [id number] at [redacted]w[redacted]d was admitted in Active Labor on 06/22/2019. Patient had an uncomplicated labor course as follows:  Membrane Rupture Time/Date: 10:34 PM ,06/22/2019   Intrapartum Procedures: Episiotomy: None [1]                                         Lacerations:  None [1]  Patient had a delivery of a Viable infant. 06/22/2019  Information for the patient's newborn:  Sharon Salinas, Girl Divya [[id number]]  Delivery Method: Vag-Spont     Pateint had an uncomplicated postpartum course.  She is ambulating, tolerating a regular diet, passing flatus, and urinating well. Patient is discharged home in stable condition on 06/24/19.   Physical exam  Vitals:   06/23/19 1132 06/23/19 1508 06/23/19 1907 06/23/19 2319  BP: 112/66 112/69 130/77 117/63  Pulse: 61 66 70 72  Resp: 18 18 18 18   Temp: 98.2 F (36.8 C) 98.2 F (36.8 C) 98.2 F (36.8 C) 98.8 F (37.1 C)  TempSrc: Oral Oral Oral Oral  SpO2:  97% 96%   Weight:      Height:       General: alert and no distress Lochia: appropriate Uterine Fundus: firm Incision: N/A DVT Evaluation: No evidence of DVT seen on physical exam. Negative Homan's sign. No cords or calf tenderness. No significant calf/ankle edema.   Labs: Lab Results  Component Value  Date   WBC 17.7 (H) 06/23/2019   HGB 10.9 (L) 06/23/2019   HCT 34.0 (L) 06/23/2019   MCV 81.7 06/23/2019   PLT 244 06/23/2019   CMP Latest Ref Rng & Units 12/02/2018  Glucose 70 - 99 mg/dL 133(H)  BUN 6 - 20 mg/dL 12  Creatinine 0.44 - 1.00 mg/dL 0.62  Sodium 135 - 145 mmol/L 138  Potassium 3.5 - 5.1 mmol/L 3.3(L)  Chloride 98 - 111 mmol/L 105  CO2 22 - 32 mmol/L 25  Calcium 8.9 - 10.3 mg/dL 9.0  Total Protein 6.5 - 8.1 g/dL 7.3  Total Bilirubin 0.3 - 1.2 mg/dL 0.3  Alkaline Phos 38 - 126 U/L 44  AST 15 - 41 U/L 15  ALT 0 - 44 U/L 12    Discharge instruction: per After Visit Summary and "Baby and Me  Booklet".  After visit meds:  Allergies as of 06/24/2019   No Known Allergies     Medication List    TAKE these medications   acetaminophen 500 MG tablet Commonly known as: TYLENOL Take 500 mg by mouth every 6 (six) hours as needed.   ibuprofen 800 MG tablet Commonly known as: ADVIL Take 1 tablet (800 mg total) by mouth every 8 (eight) hours as needed.   prenatal multivitamin Tabs tablet Take 1 tablet by mouth daily at 12 noon.       Diet: routine diet  Activity: Advance as tolerated. Pelvic rest for 6 weeks.   Outpatient follow up:6 weeks Follow up Appt:No future appointments. Follow up Visit:No follow-ups on file.  Postpartum contraception: Undecided  Newborn Data: Live born female  Birth Weight: 9 lb 11.6 oz (4410 g) APGAR: 7, 9  Newborn Delivery   Birth date/time: 06/22/2019 23:55:00 Delivery type: Vaginal, Spontaneous      Baby Feeding: Breast Disposition:home with mother   06/24/2019 Hildred LaserAnika Annaleigha Woo, MD

## 2019-06-24 NOTE — Progress Notes (Signed)
Patient discharged home with infant. Discharge instructions and prescriptions given and reviewed with patient. Patient verbalized understanding. Escorted out by staff.  

## 2019-08-13 ENCOUNTER — Ambulatory Visit (INDEPENDENT_AMBULATORY_CARE_PROVIDER_SITE_OTHER): Payer: Medicaid Other | Admitting: Obstetrics and Gynecology

## 2019-08-13 ENCOUNTER — Encounter: Payer: Self-pay | Admitting: Obstetrics and Gynecology

## 2019-08-13 ENCOUNTER — Other Ambulatory Visit: Payer: Self-pay

## 2019-08-13 DIAGNOSIS — Z3009 Encounter for other general counseling and advice on contraception: Secondary | ICD-10-CM

## 2019-08-13 DIAGNOSIS — Z3042 Encounter for surveillance of injectable contraceptive: Secondary | ICD-10-CM | POA: Diagnosis not present

## 2019-08-13 DIAGNOSIS — F53 Postpartum depression: Secondary | ICD-10-CM

## 2019-08-13 DIAGNOSIS — Z3202 Encounter for pregnancy test, result negative: Secondary | ICD-10-CM

## 2019-08-13 DIAGNOSIS — O99345 Other mental disorders complicating the puerperium: Secondary | ICD-10-CM

## 2019-08-13 LAB — POCT URINE PREGNANCY: Preg Test, Ur: NEGATIVE

## 2019-08-13 MED ORDER — MEDROXYPROGESTERONE ACETATE 150 MG/ML IM SUSP
150.0000 mg | Freq: Once | INTRAMUSCULAR | Status: AC
  Administered 2019-08-13: 150 mg via INTRAMUSCULAR

## 2019-08-13 MED ORDER — MEDROXYPROGESTERONE ACETATE 150 MG/ML IM SUSP: 150.0000 mg | INTRAMUSCULAR | 3 refills | Status: AC

## 2019-08-13 NOTE — Patient Instructions (Signed)
Use back up method for 1 week after Depo Provera injection  Contraceptive Injection A contraceptive injection is a shot that prevents pregnancy. It is also called the birth control shot. The shot contains the hormone progestin, which prevents pregnancy by:  Stopping the ovaries from releasing eggs.  Thickening cervical mucus to prevent sperm from entering the cervix.  Thinning the lining of the uterus to prevent a fertilized egg from attaching to the uterus. Contraceptive injections are given under the skin (subcutaneous) or into a muscle (intramuscular). For these shots to work, you must get one of them every 3 months (12 weeks) from a health care provider. Tell a health care provider about:  Any allergies you have.  All medicines you are taking, including vitamins, herbs, eye drops, creams, and over-the-counter medicines.  Any blood disorders you have.  Any medical conditions you have.  Whether you are pregnant or may be pregnant. What are the risks? Generally, this is a safe procedure. However, problems may occur, including:  Mood changes or depression.  Loss of bone density (osteoporosis) after long-term use.  Blood clots.  Higher risk of an egg being fertilized outside your uterus (ectopic pregnancy).This is rare. What happens before the procedure?  Your health care provider may do a routine physical exam.  You may have a test to make sure you are not pregnant. What happens during the procedure?  The area where the shot will be given will be cleaned and sanitized with alcohol.  A needle will be inserted into a muscle in your upper arm or buttock, or into the skin of your thigh or abdomen. The needle will be attached to a syringe with the medicine inside of it.  The medicine will be pushed through the syringe and injected into your body.  A small bandage (dressing) may be placed over the injection site. What can I expect after the procedure?  After the procedure,  it is common to have: ? Soreness around the injection site for a couple of days. ? Irregular menstrual bleeding. ? Weight gain. ? Breast tenderness. ? Headaches. ? Discomfort in your abdomen.  Ask your health care provider whether you need to use an added method of birth control (backup contraception), such as a condom, sponge, or spermicide. ? If the first shot is given 1-7 days after the start of your last period, you will not need backup contraception. ? If the first shot is given at any other time during your menstrual cycle, you should avoid having sex or you will need backup contraception for 7 days after you receive the shot. Follow these instructions at home: General instructions   Take over-the-counter and prescription medicines only as told by your health care provider.  Do not massage the injection site.  Track your menstrual periods so you will know if they become irregular.  Always use a condom to protect against STIs (sexually transmitted infections).  Make sure you schedule an appointment in time for your next shot, and mark it on your calendar. For the birth control to prevent pregnancy, you must get the injections every 3 months (12 weeks). Lifestyle  Do not use any products that contain nicotine or tobacco, such as cigarettes and e-cigarettes. If you need help quitting, ask your health care provider.  Eat foods that are high in calcium and vitamin D, such as milk, cheese, and salmon. Doing this may help with any loss in bone density that is caused by the contraceptive injection. Ask your health care  provider for dietary recommendations. Contact a health care provider if:  You have nausea or vomiting.  You have abnormal vaginal discharge or bleeding.  You miss a period or you think you might be pregnant.  You experience mood changes or depression.  You feel dizzy or light-headed.  You have leg pain. Get help right away if:  You have chest pain.  You  cough up blood.  You have shortness of breath.  You have a severe headache that does not go away.  You have numbness in any part of your body.  You have slurred speech.  You have vision problems.  You have vaginal bleeding that is abnormally heavy or does not stop.  You have severe pain in your abdomen.  You have depression that does not get better with treatment. If you ever feel like you may hurt yourself or others, or have thoughts about taking your own life, get help right away. You can go to your nearest emergency department or call:  Your local emergency services (911 in the [place]  A suicide crisis helpline, such as the National Suicide Prevention Lifeline at 860-739-01421-4146133914. This is open 24 hours a day. Summary  A contraceptive injection is a shot that prevents pregnancy. It is also called the birth control shot.  The shot is given under the skin (subcutaneous) or into a muscle (intramuscular).  After this procedure, it is common to have soreness around the injection site for a couple of days.  To prevent pregnancy, the shot must be given by a health care provider every 3 months (12 weeks).  After you have the shot, ask your health care provider whether you need to use an added method of birth control (backup contraception), such as a condom, sponge, or spermicide. This information is not intended to replace advice given to you by your health care provider. Make sure you discuss any questions you have with your health care provider. Document Released: 07/23/2017 Document Revised: 11/09/2017 Document Reviewed: 07/23/2017 Elsevier Patient Education  2020 [employer]

## 2019-08-13 NOTE — Progress Notes (Signed)
Pt is present today for post partum. Pt stated having sexual intercourse since the birth of the baby without any protection. Pt stated that she is not sure of birth control at this time. Pt stated that she is breastfeeding her newborn baby without any problems. EPDS=13. Depo injection given today.

## 2019-08-13 NOTE — Progress Notes (Signed)
   OBSTETRICS POSTPARTUM CLINIC PROGRESS NOTE  Subjective:     Sharon Salinas is a 33 y.o. [phone] female who presents for a postpartum visit. She is 6 weeks postpartum following a spontaneous vaginal delivery. I have fully reviewed the prenatal and intrapartum course. The delivery was at 40.6 gestational weeks.  Anesthesia: epidural. Postpartum course has been well. Baby's course has been well. Baby is feeding by breast. Bleeding: patient has not resumed menses, with No LMP recorded.. Bowel function is normal. Bladder function is normal. Patient is sexually active (no protection). Contraception method desired is undecided. Postpartum depression screening: positive.  The following portions of the patient's history were reviewed and updated as appropriate: allergies, current medications, past family history, past medical history, past social history, past surgical history and problem list.  Review of Systems Pertinent items noted in HPI and remainder of comprehensive ROS otherwise negative.   Objective:    BP 102/66   Pulse 60   Ht 5\' 9"  (1.753 m)   Wt 227 lb 11.2 oz (103.3 kg)   Breastfeeding Yes   BMI 33.63 kg/m   General:  alert and no distress   Breasts:  inspection negative, no nipple discharge or bleeding, no masses or nodularity palpable  Lungs: clear to auscultation bilaterally  Heart:  regular rate and rhythm, S1, S2 normal, no murmur, click, rub or gallop  Abdomen: soft, non-tender; bowel sounds normal; no masses,  no organomegaly.     Vulva:  normal  Vagina: normal vagina, no discharge, exudate, lesion, or erythema  Cervix:  no cervical motion tenderness and no lesions  Corpus: normal size, contour, position, consistency, mobility, non-tender  Adnexa:  normal adnexa and no mass, fullness, tenderness  Rectal Exam: Not performed.         Labs:  Lab Results  Component Value Date   HGB 10.9 (L) 06/23/2019     Assessment:    Routine postpartum exam.     Postpartum depression  Contraception counseling  Plan:    1. Contraception: Reviewed all forms of birth control options available including abstinence; fertility period awareness methods; over the counter/barrier methods; hormonal contraceptive medication including pill, patch, ring, injection,contraceptive implant; hormonal and nonhormonal IUDs; permanent sterilization options including vasectomy and the various tubal sterilization modalities. Risks and benefits reviewed.  Questions were answered.  Patient desires to receive Depo Provera injection today. UPT negative. Advised on back up method for 1 week.  2. Discussion had with patient today regarding symptoms.  Likely suffering from mild postpartum depression.  Currently no SI/HI.  Patient notes that she has good support at home.  Advised on getting as much rest as possible and utilizing her support system as sleep deprivation can also intensify symptoms. Discussed options for management, including counseling and/or medications, or both. Also discussed natural supplements like SAM-E.   Patient unsure of desires but may try SAM-E first. Will have Social care manager f/u in 1-2 weeks.  3. Follow up in: 3 months for annual exam and next Depo injection, or sooner as needed.    Rubie Maid, MD [facility]

## 2019-11-12 ENCOUNTER — Ambulatory Visit: Payer: Medicaid Other

## 2020-02-22 IMAGING — US US EXTREM LOW VENOUS BILAT
1 series · 13 of 24 positions shown · non-contrast
Comparison: None.

CLINICAL DATA: Acute onset of bilateral lower extremity swelling.



[Series 1: us extrem low venous bilat · 13 of 60 slices shown]
[im 1/60]
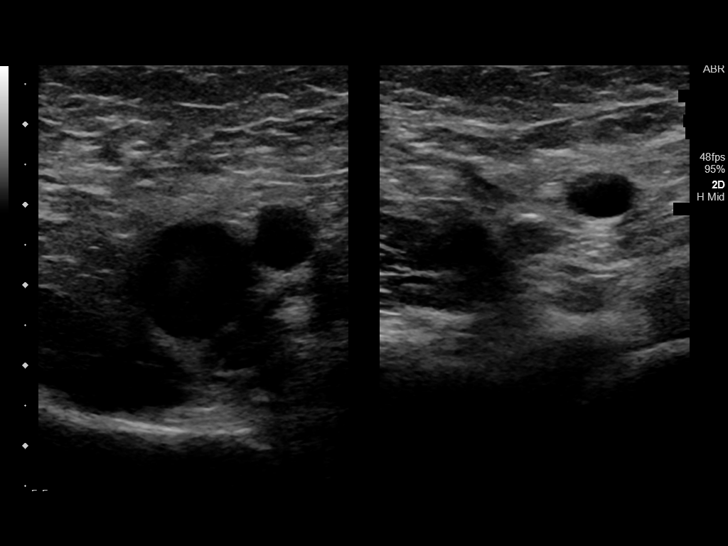
[im 6/60]
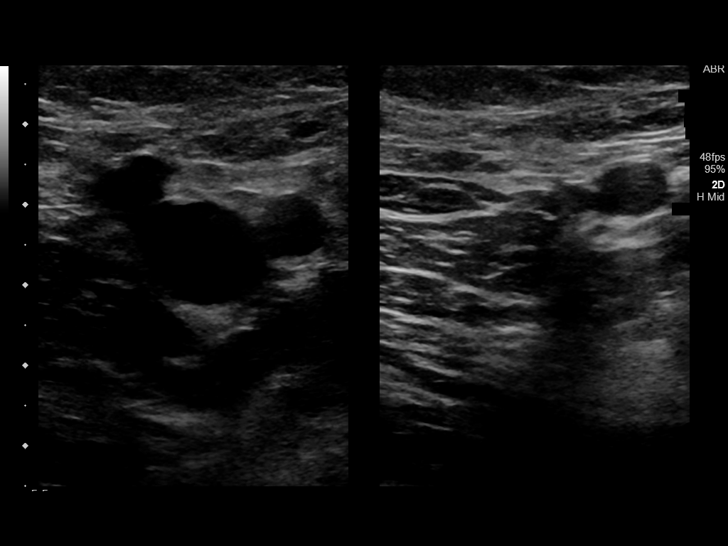
[im 11/60]
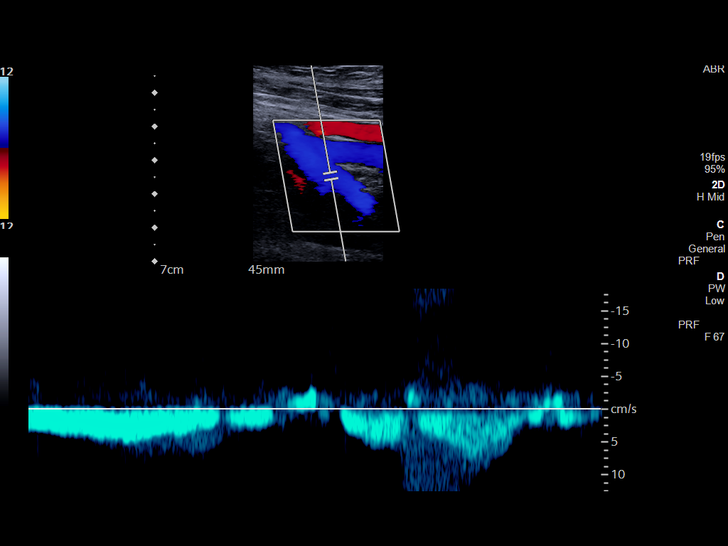
[im 16/60]
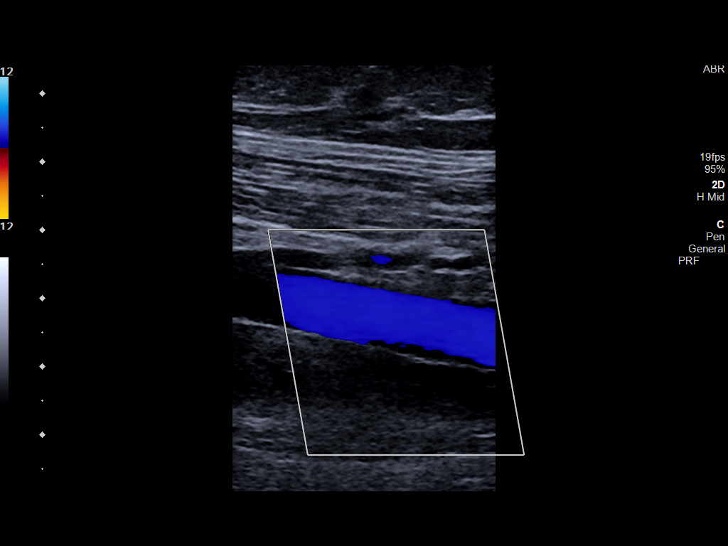
[im 21/60]
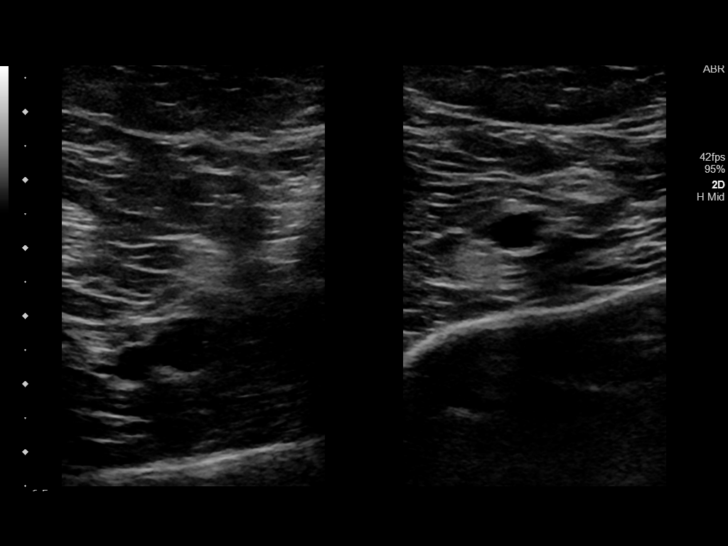
[im 26/60]
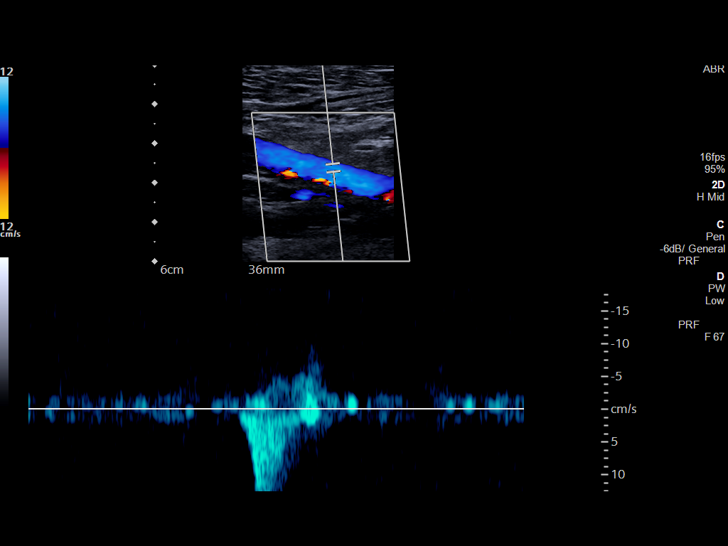
[im 31/60]
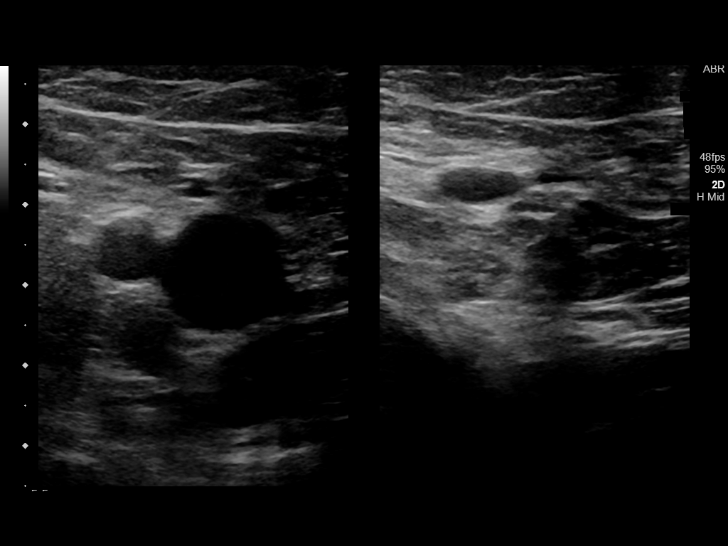
[im 34/60]
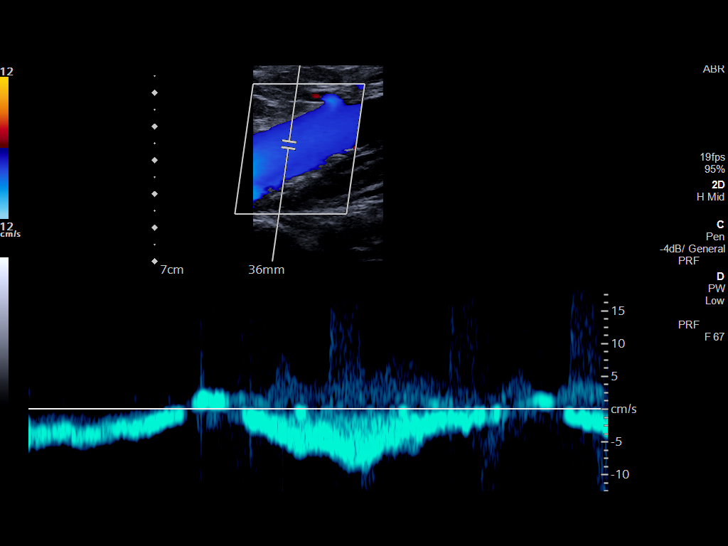
[im 39/60]
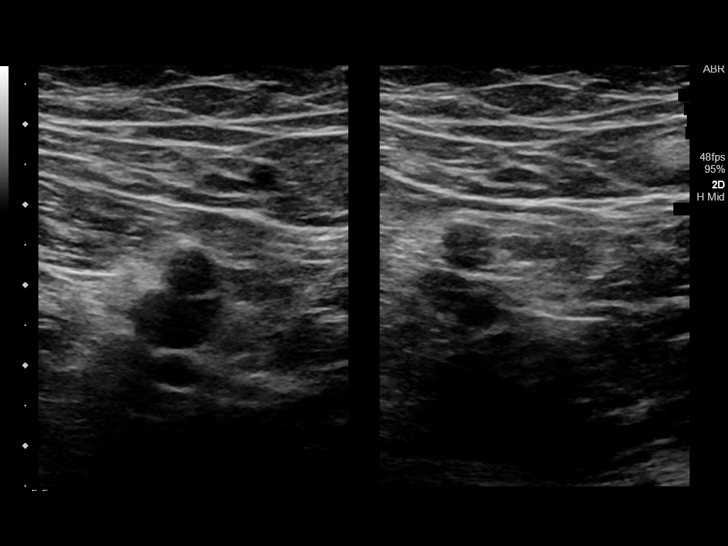
[im 44/60]
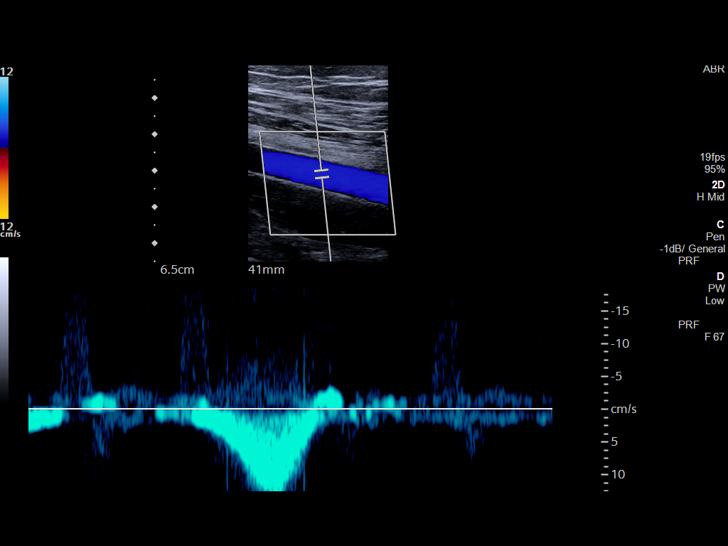
[im 49/60]
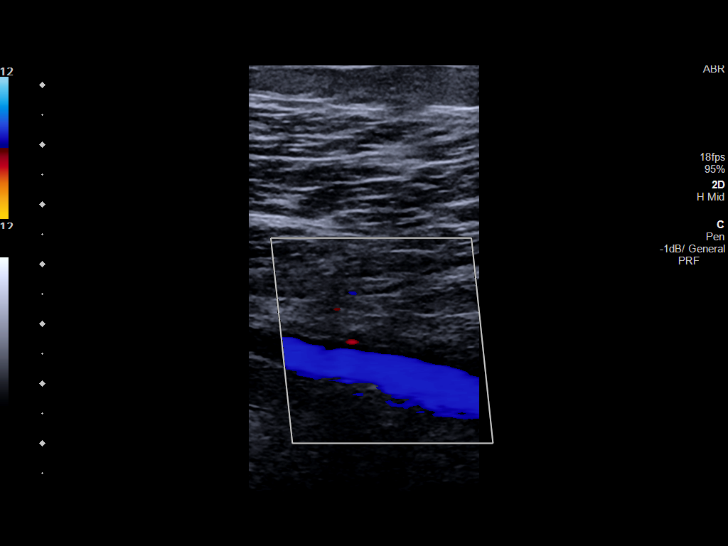
[im 54/60]
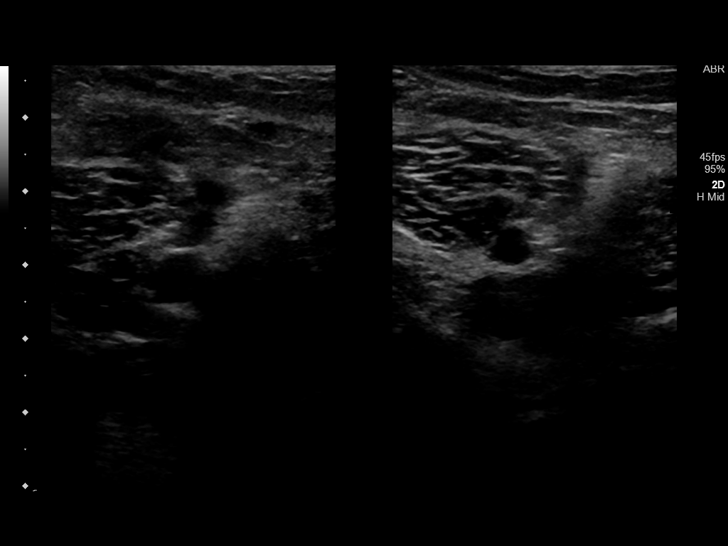
[im 60/60]
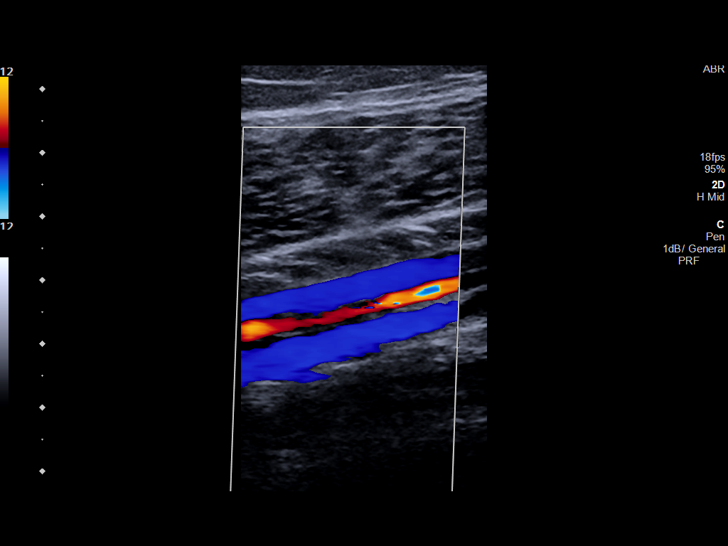

[13 of 24 positions shown; findings below may reference images not displayed]

FINDINGS: RIGHT LOWER EXTREMITY

Common Femoral Vein: No evidence of thrombus. Normal
compressibility, respiratory phasicity and response to augmentation.

Saphenofemoral Junction: No evidence of thrombus. Normal
compressibility and flow on color Doppler imaging.

Profunda Femoral Vein: No evidence of thrombus. Normal
compressibility and flow on color Doppler imaging.

Femoral Vein: No evidence of thrombus. Normal compressibility,
respiratory phasicity and response to augmentation.

Popliteal Vein: No evidence of thrombus. Normal compressibility,
respiratory phasicity and response to augmentation.

Calf Veins: No evidence of thrombus. Normal compressibility and flow
on color Doppler imaging.

Superficial Great Saphenous Vein: No evidence of thrombus. Normal
compressibility.

Venous Reflux:  None.

Other Findings:  None.

LEFT LOWER EXTREMITY

Common Femoral Vein: No evidence of thrombus. Normal
compressibility, respiratory phasicity and response to augmentation.

Saphenofemoral Junction: No evidence of thrombus. Normal
compressibility and flow on color Doppler imaging.

Profunda Femoral Vein: No evidence of thrombus. Normal
compressibility and flow on color Doppler imaging.

Femoral Vein: No evidence of thrombus. Normal compressibility,
respiratory phasicity and response to augmentation.

Popliteal Vein: No evidence of thrombus. Normal compressibility,
respiratory phasicity and response to augmentation.

Calf Veins: No evidence of thrombus. Normal compressibility and flow
on color Doppler imaging.

Superficial Great Saphenous Vein: No evidence of thrombus. Normal
compressibility.

Venous Reflux:  None.

Other Findings:  None.
IMPRESSION: No evidence of deep venous thrombosis.

## 2020-02-22 IMAGING — CR DG CHEST 2V
2 series · 2 of 2 positions shown · non-contrast
Comparison: None.

CLINICAL DATA: LEFT back pain radiating to chest.

EXAM:
CHEST - 2 VIEW

[chest pa]
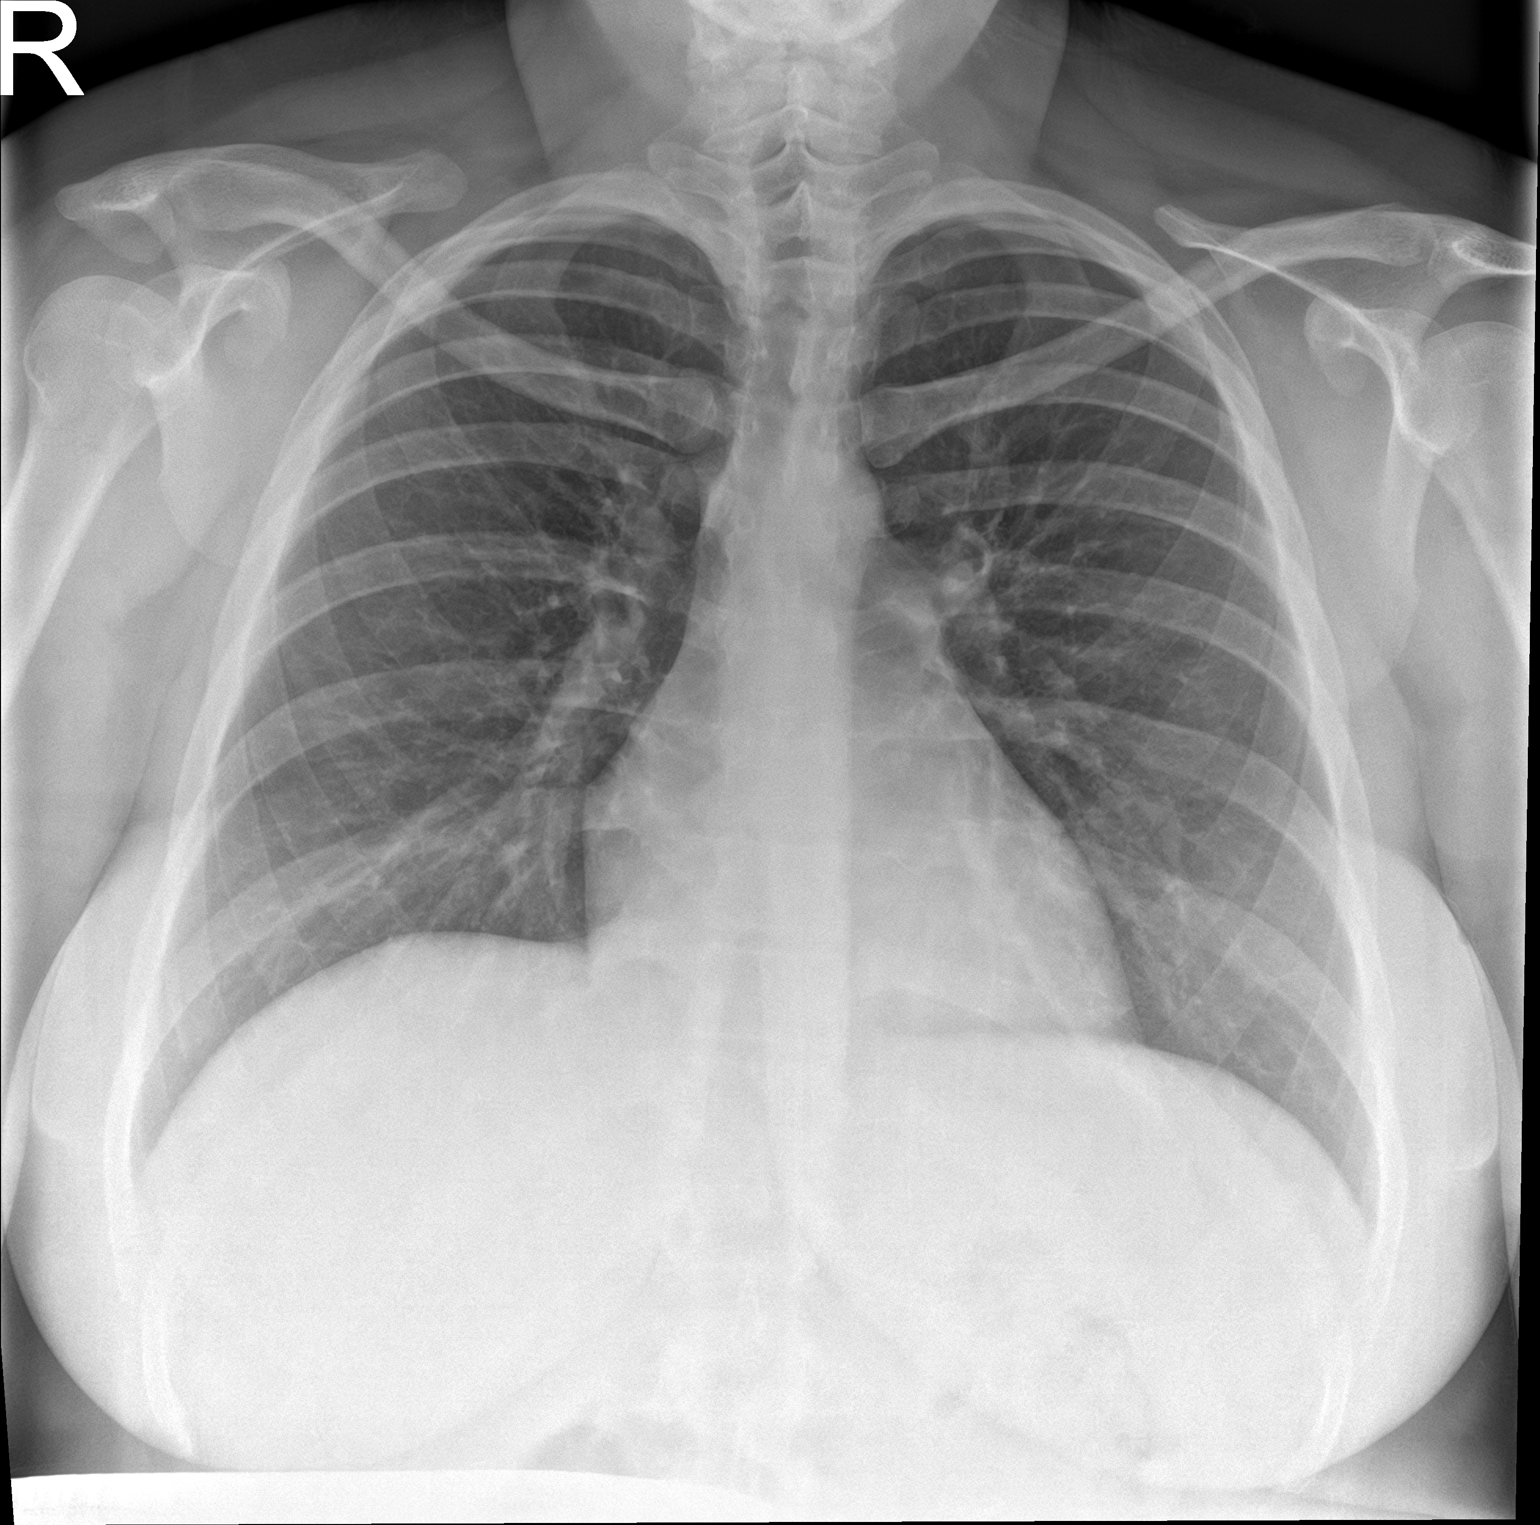

[chest lat]
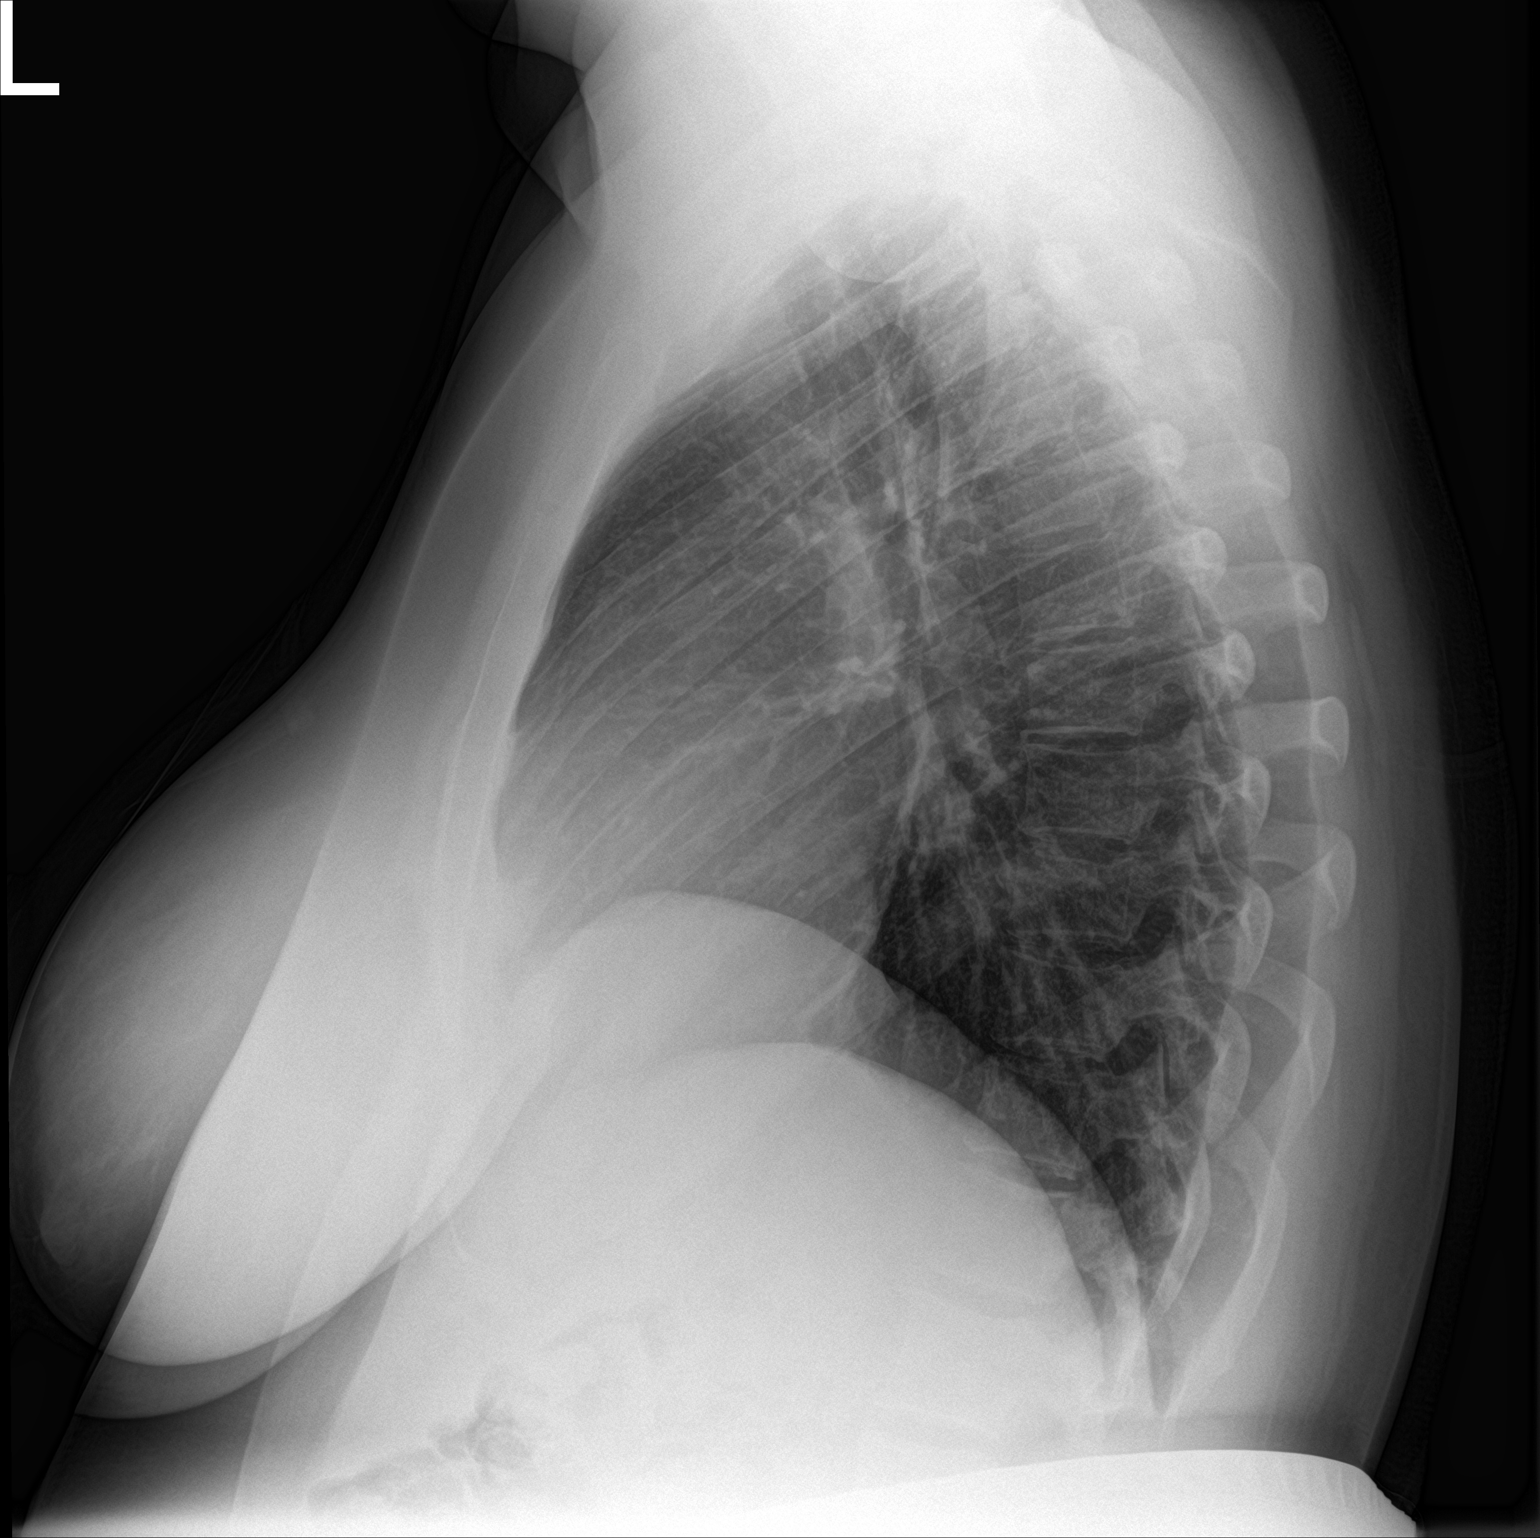

[2 of 2 positions shown; findings below may reference images not displayed]

FINDINGS: Cardiomediastinal silhouette is normal. No pleural effusions or
focal consolidations. Trachea projects midline and there is no
pneumothorax. Soft tissue planes and included osseous structures are
non-suspicious.
IMPRESSION: Normal chest radiograph.
